# Patient Record
Sex: Male | Born: 1975 | Race: Black or African American | Hispanic: No | Marital: Single | Smoking: Current every day smoker
Health system: Southern US, Community
[De-identification: ages and names within clinical notes are randomized; demographics above are authoritative.]

## PROBLEM LIST (undated history)

## (undated) DIAGNOSIS — E119 Type 2 diabetes mellitus without complications: Secondary | ICD-10-CM

## (undated) DIAGNOSIS — E785 Hyperlipidemia, unspecified: Secondary | ICD-10-CM

## (undated) DIAGNOSIS — I639 Cerebral infarction, unspecified: Secondary | ICD-10-CM

## (undated) DIAGNOSIS — I10 Essential (primary) hypertension: Secondary | ICD-10-CM

---

## 2002-06-22 ENCOUNTER — Encounter: Payer: Self-pay | Admitting: Emergency Medicine

## 2002-06-22 ENCOUNTER — Emergency Department (HOSPITAL_COMMUNITY): Admission: EM | Admit: 2002-06-22 | Discharge: 2002-06-23 | Payer: Self-pay | Admitting: Emergency Medicine

## 2005-03-07 ENCOUNTER — Emergency Department (HOSPITAL_COMMUNITY): Admission: EM | Admit: 2005-03-07 | Discharge: 2005-03-07 | Payer: Self-pay | Admitting: Emergency Medicine

## 2006-02-26 ENCOUNTER — Emergency Department: Payer: Self-pay | Admitting: Emergency Medicine

## 2006-03-11 ENCOUNTER — Emergency Department: Payer: Self-pay | Admitting: Emergency Medicine

## 2006-08-10 ENCOUNTER — Emergency Department: Payer: Self-pay | Admitting: Emergency Medicine

## 2008-02-26 ENCOUNTER — Emergency Department: Payer: Self-pay | Admitting: Emergency Medicine

## 2011-01-25 ENCOUNTER — Emergency Department: Payer: Self-pay | Admitting: Emergency Medicine

## 2011-06-04 ENCOUNTER — Emergency Department (HOSPITAL_COMMUNITY)
Admission: EM | Admit: 2011-06-04 | Discharge: 2011-06-04 | Disposition: A | Discharge status: Home / Self Care | Payer: Self-pay | Attending: Emergency Medicine | Admitting: Emergency Medicine

## 2011-06-04 DIAGNOSIS — R51 Headache: Secondary | ICD-10-CM | POA: Insufficient documentation

## 2011-06-04 DIAGNOSIS — I10 Essential (primary) hypertension: Secondary | ICD-10-CM | POA: Insufficient documentation

## 2011-06-04 DIAGNOSIS — R6884 Jaw pain: Secondary | ICD-10-CM | POA: Insufficient documentation

## 2011-06-04 DIAGNOSIS — H9209 Otalgia, unspecified ear: Secondary | ICD-10-CM | POA: Insufficient documentation

## 2011-06-04 DIAGNOSIS — E119 Type 2 diabetes mellitus without complications: Secondary | ICD-10-CM | POA: Insufficient documentation

## 2011-06-04 DIAGNOSIS — Z8673 Personal history of transient ischemic attack (TIA), and cerebral infarction without residual deficits: Secondary | ICD-10-CM | POA: Insufficient documentation

## 2012-04-21 IMAGING — CT CT HEAD WITHOUT CONTRAST
2 series · 16 of 30 positions shown, 20 images · non-contrast
Comparison: none

REASON FOR EXAM: headache
COMMENTS:

PROCEDURE:     CT  - CT HEAD WITHOUT CONTRAST  - January 25, 2011  [DATE]
RESULT:     History: Headache.
Comparison Study: No recent.

[Series 2: without · axial · non-contrast · 0.41mm/px · z∈[+256,+376]mm · 13 of 30 slices shown, 17 images]
[im 3/30  brain]
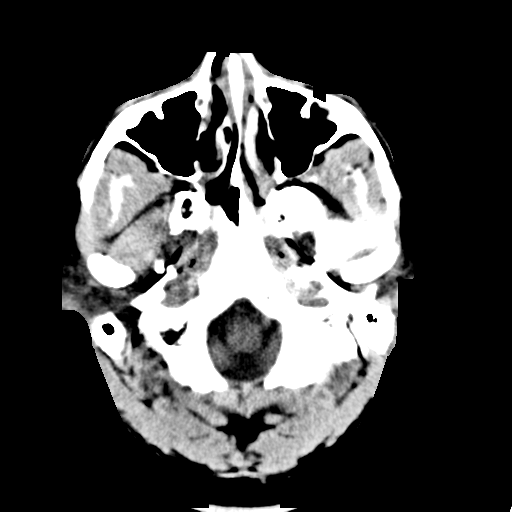
[im 3/30  bone]
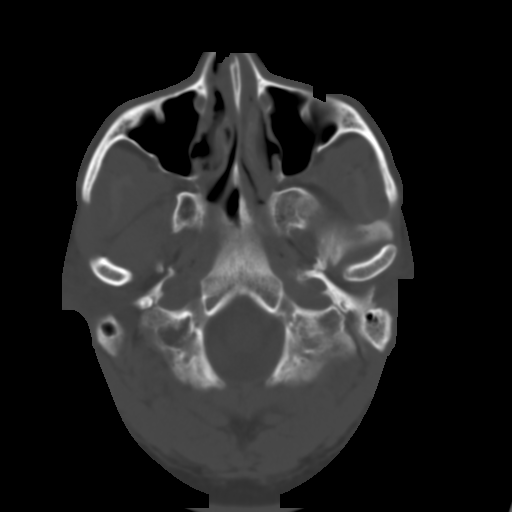
[im 5/30  brain]
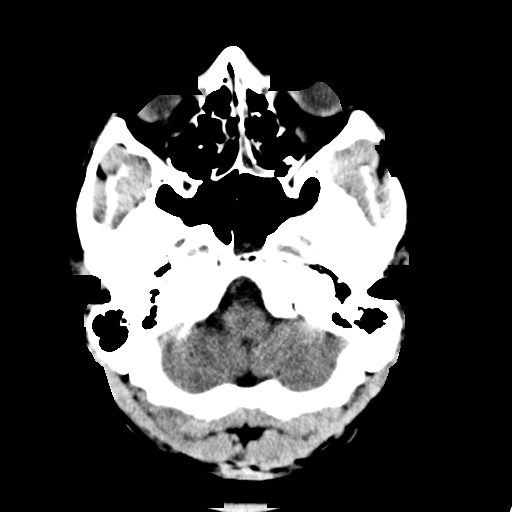
[im 7/30  brain]
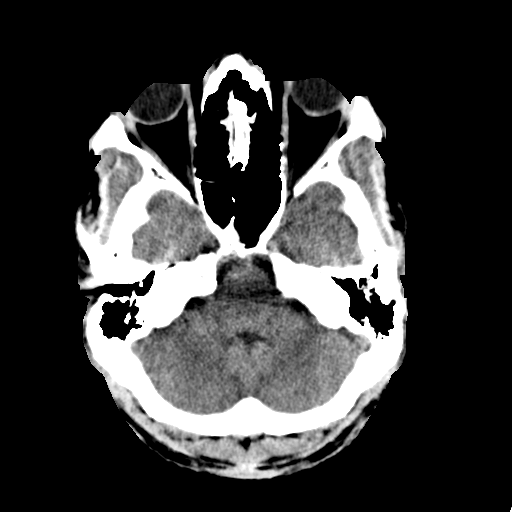
[im 9/30  brain]
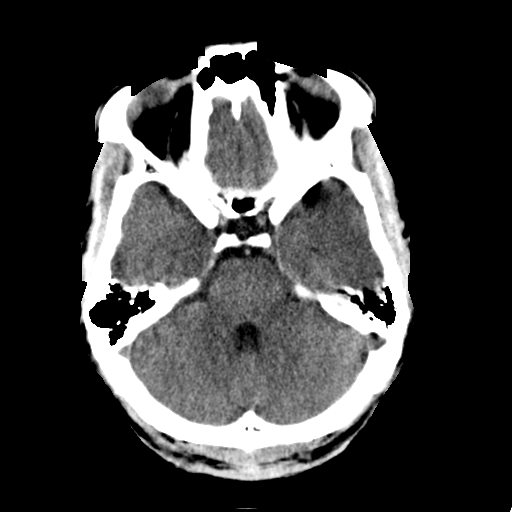
[im 11/30  brain]
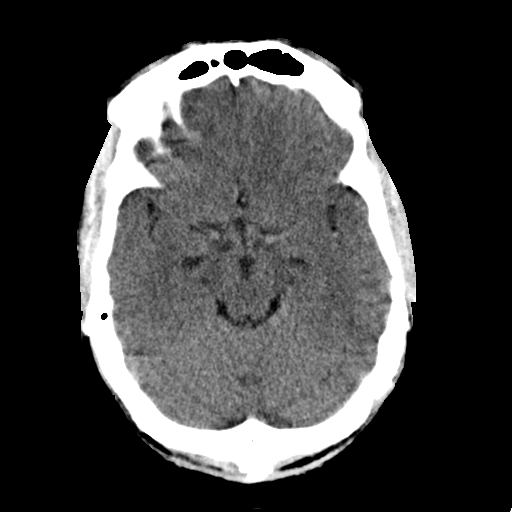
[im 11/30  bone]
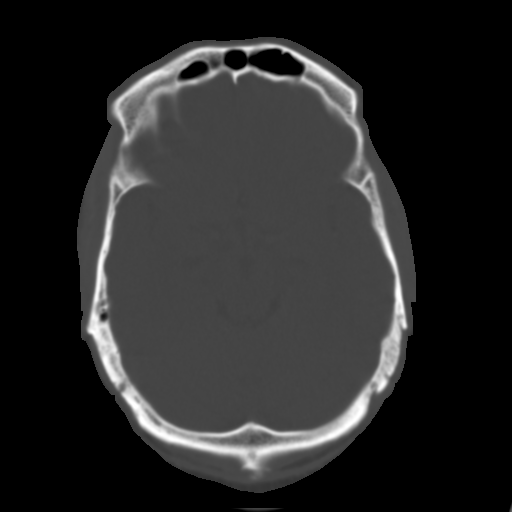
[im 13/30  brain]
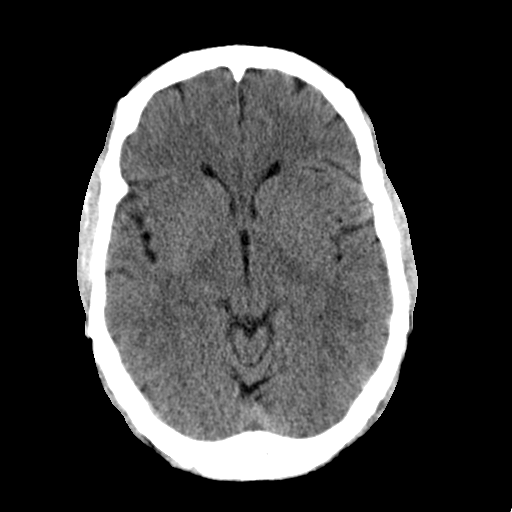
[im 15/30  brain]
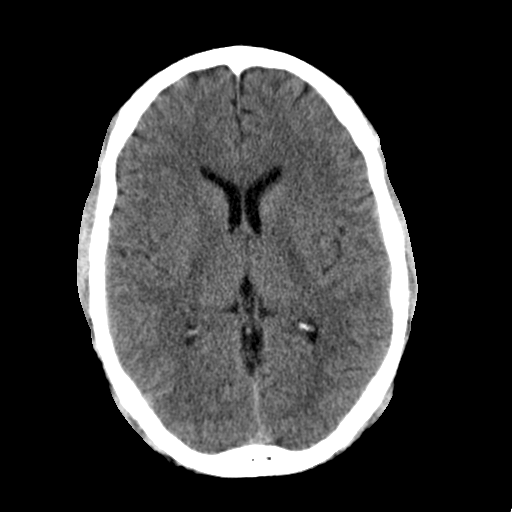
[im 17/30  brain]
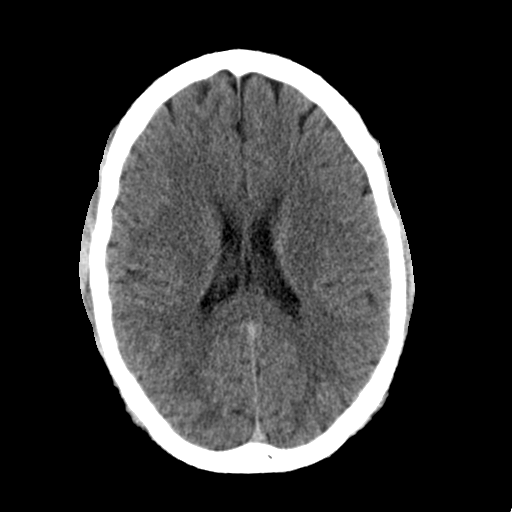
[im 19/30  brain]
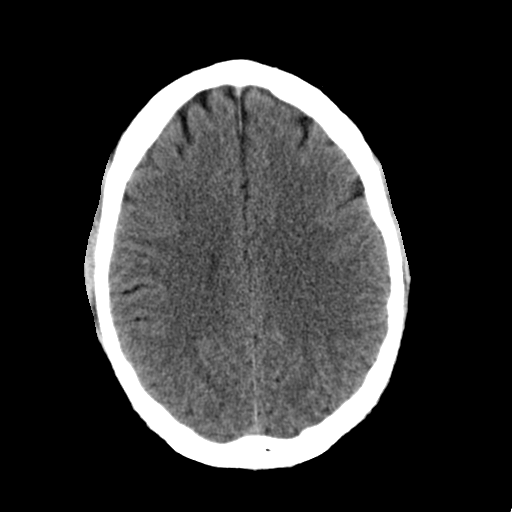
[im 19/30  bone]
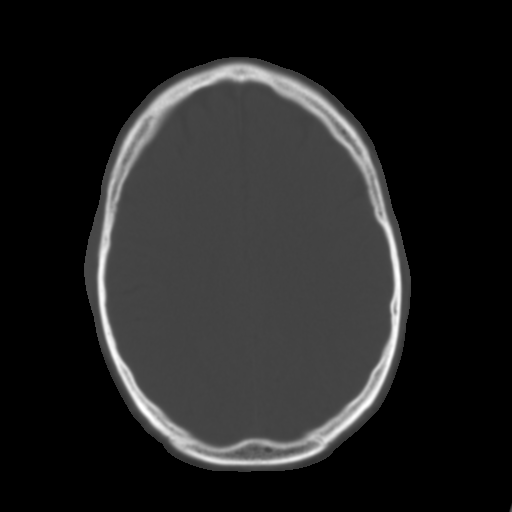
[im 21/30  brain]
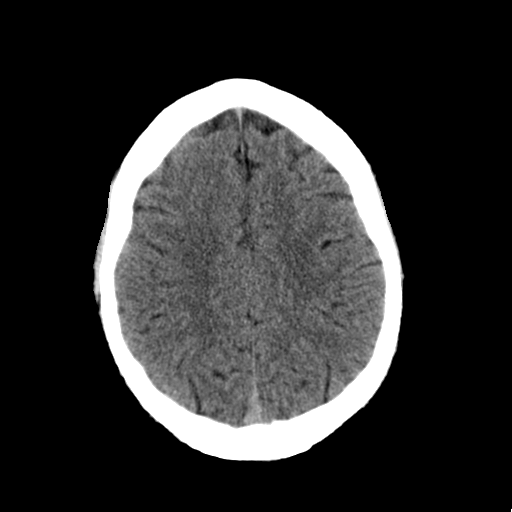
[im 23/30  brain]
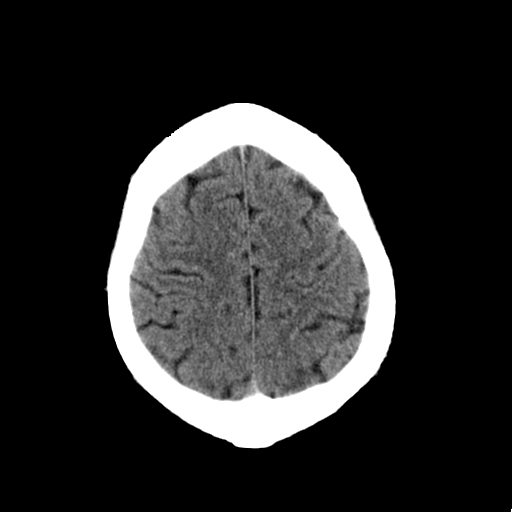
[im 25/30  brain]
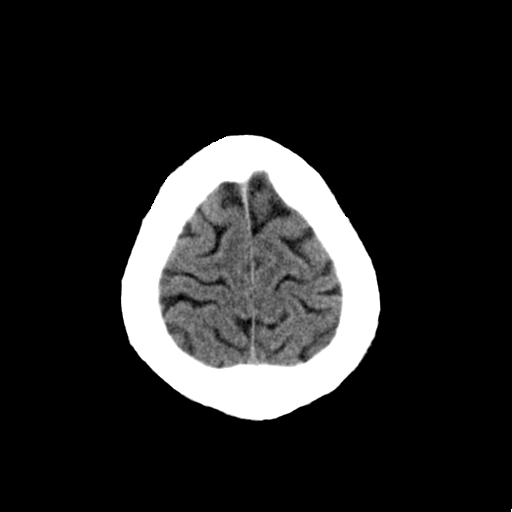
[im 27/30  brain]
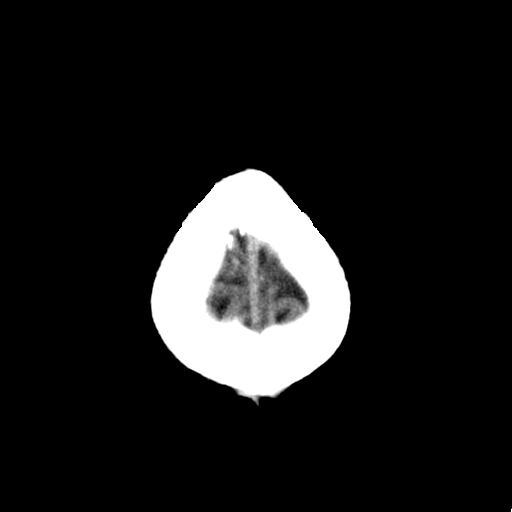
[im 27/30  bone]
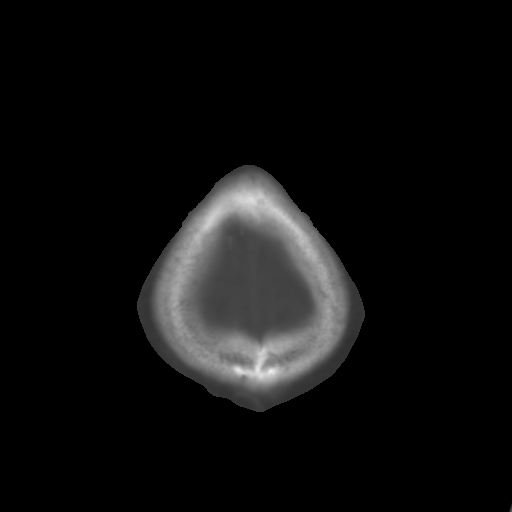

[Series 3: bone · axial · 0.41mm/px · z∈[+256,+296]mm · 3 of 30 slices shown]
[im 3/30  bone]
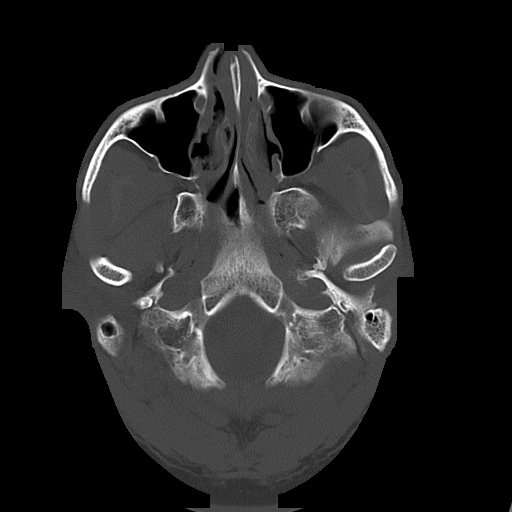
[im 7/30  bone]
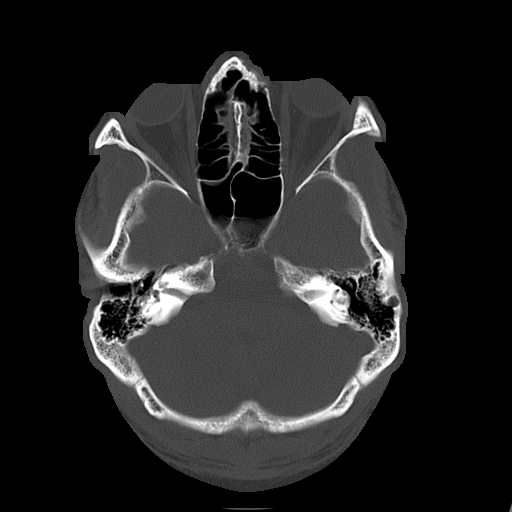
[im 11/30  bone]
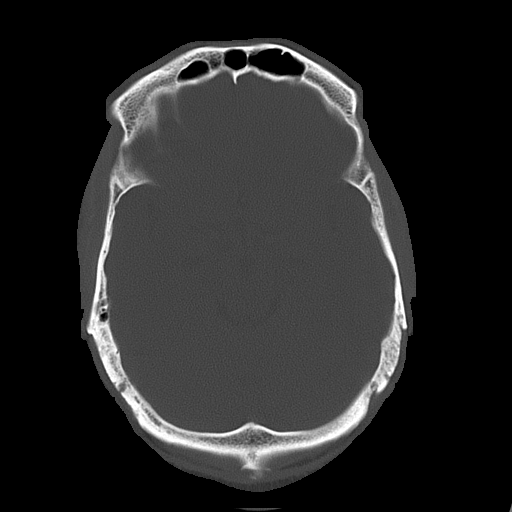

[16 of 30 positions shown; findings below may reference images not displayed]

FINDINGS: Standard head CT obtained. No mass lesion. No hydrocephalus. No
hemorrhage. No acute bony abnormality. Mucus retention cysts noted in
maxillary sinuses.
IMPRESSION: No acute abnormality.

## 2014-10-01 ENCOUNTER — Emergency Department: Payer: Self-pay | Admitting: Emergency Medicine

## 2014-10-04 ENCOUNTER — Emergency Department: Payer: Self-pay | Admitting: Emergency Medicine

## 2016-10-17 ENCOUNTER — Emergency Department: Payer: Self-pay

## 2016-10-17 ENCOUNTER — Encounter: Payer: Self-pay | Admitting: Emergency Medicine

## 2016-10-17 ENCOUNTER — Observation Stay
Admission: EM | Admit: 2016-10-17 | Discharge: 2016-10-18 | Disposition: A | Discharge status: Home / Self Care | Payer: Self-pay | Attending: Internal Medicine | Admitting: Internal Medicine

## 2016-10-17 DIAGNOSIS — R079 Chest pain, unspecified: Secondary | ICD-10-CM | POA: Insufficient documentation

## 2016-10-17 DIAGNOSIS — R29898 Other symptoms and signs involving the musculoskeletal system: Secondary | ICD-10-CM

## 2016-10-17 DIAGNOSIS — Z7982 Long term (current) use of aspirin: Secondary | ICD-10-CM | POA: Insufficient documentation

## 2016-10-17 DIAGNOSIS — F17210 Nicotine dependence, cigarettes, uncomplicated: Secondary | ICD-10-CM | POA: Insufficient documentation

## 2016-10-17 DIAGNOSIS — E785 Hyperlipidemia, unspecified: Secondary | ICD-10-CM | POA: Insufficient documentation

## 2016-10-17 DIAGNOSIS — R262 Difficulty in walking, not elsewhere classified: Secondary | ICD-10-CM

## 2016-10-17 DIAGNOSIS — I10 Essential (primary) hypertension: Secondary | ICD-10-CM | POA: Insufficient documentation

## 2016-10-17 DIAGNOSIS — M6281 Muscle weakness (generalized): Principal | ICD-10-CM

## 2016-10-17 DIAGNOSIS — R200 Anesthesia of skin: Secondary | ICD-10-CM

## 2016-10-17 DIAGNOSIS — R531 Weakness: Secondary | ICD-10-CM

## 2016-10-17 DIAGNOSIS — R202 Paresthesia of skin: Secondary | ICD-10-CM | POA: Insufficient documentation

## 2016-10-17 DIAGNOSIS — I160 Hypertensive urgency: Secondary | ICD-10-CM

## 2016-10-17 DIAGNOSIS — Z8673 Personal history of transient ischemic attack (TIA), and cerebral infarction without residual deficits: Secondary | ICD-10-CM | POA: Insufficient documentation

## 2016-10-17 HISTORY — DX: Essential (primary) hypertension: I10

## 2016-10-17 HISTORY — DX: Cerebral infarction, unspecified: I63.9

## 2016-10-17 HISTORY — DX: Hyperlipidemia, unspecified: E78.5

## 2016-10-17 LAB — CBC
HCT: 47.4 % (ref 40.0–52.0)
Hemoglobin: 16.1 g/dL (ref 13.0–18.0)
MCH: 29.2 pg (ref 26.0–34.0)
MCHC: 34 g/dL (ref 32.0–36.0)
MCV: 85.9 fL (ref 80.0–100.0)
PLATELETS: 238 10*3/uL (ref 150–440)
RBC: 5.52 MIL/uL (ref 4.40–5.90)
RDW: 13.2 % (ref 11.5–14.5)
WBC: 7.3 10*3/uL (ref 3.8–10.6)

## 2016-10-17 LAB — DIFFERENTIAL
BASOS ABS: 0 10*3/uL (ref 0–0.1)
BASOS PCT: 1 %
Eosinophils Absolute: 0.4 10*3/uL (ref 0–0.7)
Eosinophils Relative: 5 %
Lymphocytes Relative: 37 %
Lymphs Abs: 2.7 10*3/uL (ref 1.0–3.6)
MONOS PCT: 8 %
Monocytes Absolute: 0.6 10*3/uL (ref 0.2–1.0)
NEUTROS ABS: 3.6 10*3/uL (ref 1.4–6.5)
Neutrophils Relative %: 49 %

## 2016-10-17 LAB — COMPREHENSIVE METABOLIC PANEL
ALT: 39 U/L (ref 17–63)
AST: 27 U/L (ref 15–41)
Albumin: 4.1 g/dL (ref 3.5–5.0)
Alkaline Phosphatase: 58 U/L (ref 38–126)
Anion gap: 7 (ref 5–15)
BUN: 10 mg/dL (ref 6–20)
CHLORIDE: 103 mmol/L (ref 101–111)
CO2: 30 mmol/L (ref 22–32)
CREATININE: 1.16 mg/dL (ref 0.61–1.24)
Calcium: 8.7 mg/dL — ABNORMAL LOW (ref 8.9–10.3)
GFR calc Af Amer: >60 mL/min (ref >60)
GFR calc non Af Amer: >60 mL/min (ref >60)
Glucose, Bld: 143 mg/dL — ABNORMAL HIGH (ref 65–99)
Potassium: 3.5 mmol/L (ref 3.5–5.1)
SODIUM: 140 mmol/L (ref 135–145)
Total Bilirubin: 0.5 mg/dL (ref 0.3–1.2)
Total Protein: 7.7 g/dL (ref 6.5–8.1)

## 2016-10-17 LAB — TROPONIN I: Troponin I: <0.03 ng/mL (ref <0.03)

## 2016-10-17 LAB — PROTIME-INR
INR: 0.9
Prothrombin Time: 12.1 seconds (ref 11.4–15.2)

## 2016-10-17 LAB — INFLUENZA PANEL BY PCR (TYPE A & B)
Influenza A By PCR: NEGATIVE
Influenza B By PCR: NEGATIVE

## 2016-10-17 LAB — APTT: APTT: 31 s (ref 24–36)

## 2016-10-17 MED ORDER — AMLODIPINE BESYLATE 10 MG PO TABS
10.0000 mg | ORAL_TABLET | Freq: Every day | ORAL | Status: DC
  Administered 2016-10-17 – 2016-10-18 (×2): 10 mg via ORAL
  Filled 2016-10-17 (×2): qty 1

## 2016-10-17 MED ORDER — STROKE: EARLY STAGES OF RECOVERY BOOK
Freq: Once | Status: AC
  Administered 2016-10-17: 18:00:00

## 2016-10-17 MED ORDER — LABETALOL HCL 5 MG/ML IV SOLN
10.0000 mg | Freq: Once | INTRAVENOUS | Status: AC
  Administered 2016-10-17: 10 mg via INTRAVENOUS
  Filled 2016-10-17: qty 4

## 2016-10-17 MED ORDER — ACETAMINOPHEN 650 MG RE SUPP: 650.0000 mg | RECTAL | Status: DC | PRN

## 2016-10-17 MED ORDER — ISOSORBIDE MONONITRATE ER 60 MG PO TB24
30.0000 mg | ORAL_TABLET | Freq: Every day | ORAL | Status: DC
  Administered 2016-10-17 – 2016-10-18 (×2): 30 mg via ORAL
  Filled 2016-10-17 (×2): qty 1

## 2016-10-17 MED ORDER — ASPIRIN 325 MG PO TABS
325.0000 mg | ORAL_TABLET | Freq: Every day | ORAL | Status: DC
  Administered 2016-10-18: 325 mg via ORAL
  Filled 2016-10-17: qty 1

## 2016-10-17 MED ORDER — LABETALOL HCL 5 MG/ML IV SOLN
40.0000 mg | Freq: Once | INTRAVENOUS | Status: AC
  Administered 2016-10-17: 40 mg via INTRAVENOUS
  Filled 2016-10-17: qty 8

## 2016-10-17 MED ORDER — HYDRALAZINE HCL 20 MG/ML IJ SOLN
10.0000 mg | Freq: Four times a day (QID) | INTRAMUSCULAR | Status: DC | PRN
  Administered 2016-10-17: 10 mg via INTRAVENOUS

## 2016-10-17 MED ORDER — ACETAMINOPHEN 160 MG/5ML PO SOLN: 650.0000 mg | ORAL | Status: DC | PRN

## 2016-10-17 MED ORDER — ASPIRIN 81 MG PO CHEW
324.0000 mg | CHEWABLE_TABLET | Freq: Once | ORAL | Status: AC
  Administered 2016-10-17: 324 mg via ORAL
  Filled 2016-10-17: qty 4

## 2016-10-17 MED ORDER — ENOXAPARIN SODIUM 40 MG/0.4ML ~~LOC~~ SOLN
40.0000 mg | SUBCUTANEOUS | Status: DC
  Administered 2016-10-17: 21:00:00 40 mg via SUBCUTANEOUS
  Filled 2016-10-17: qty 0.4

## 2016-10-17 MED ORDER — ASPIRIN 300 MG RE SUPP: 300.0000 mg | Freq: Every day | RECTAL | Status: DC

## 2016-10-17 MED ORDER — NICOTINE 14 MG/24HR TD PT24
14.0000 mg | MEDICATED_PATCH | Freq: Every day | TRANSDERMAL | Status: DC
  Filled 2016-10-17: qty 1

## 2016-10-17 MED ORDER — SENNOSIDES-DOCUSATE SODIUM 8.6-50 MG PO TABS: 1.0000 | ORAL_TABLET | Freq: Every evening | ORAL | Status: DC | PRN

## 2016-10-17 MED ORDER — HYDRALAZINE HCL 20 MG/ML IJ SOLN
INTRAMUSCULAR | Status: AC
  Administered 2016-10-17: 10 mg via INTRAVENOUS
  Filled 2016-10-17: qty 1

## 2016-10-17 MED ORDER — ATORVASTATIN CALCIUM 20 MG PO TABS
40.0000 mg | ORAL_TABLET | Freq: Every day | ORAL | Status: DC
  Administered 2016-10-17: 40 mg via ORAL
  Filled 2016-10-17: qty 2

## 2016-10-17 MED ORDER — ACETAMINOPHEN 325 MG PO TABS
650.0000 mg | ORAL_TABLET | ORAL | Status: DC | PRN
  Administered 2016-10-17: 23:00:00 650 mg via ORAL
  Filled 2016-10-17: qty 2

## 2016-10-17 NOTE — ED Notes (Signed)
Pt states x 1 week has felt L arm stiffness and neck stiffness. States dizziness. Denies blurred vision. Alert and oriented, ambulatory. Pt states hx of stroke in 2012 that left him without use of his L arm. Pt is able to move L arm at this time. Pt states unable to move l leg as well but able to lift it off the bed and walk on it.   Dr. Shaune PollackLord at bedside.

## 2016-10-17 NOTE — ED Notes (Signed)
Pt sleeping, lights turned down for comfort.

## 2016-10-17 NOTE — ED Notes (Signed)
CBG 145  

## 2016-10-17 NOTE — ED Triage Notes (Signed)
Pt comes into the ED via POV c/o left sided weakness and slurred speech.  Patient states that the symptoms have been going on since his birthday last [birth year], but they have significantly gotten worse today.  Patient went to bed last night around 23:00 and woke up this morning with slurred speech.  Patient in NAD at this time with even and unlabored respirations.  Patient states he has chest pain and presents diaphoretic as well.

## 2016-10-17 NOTE — ED Triage Notes (Signed)
Pt reports woke up this morning and had slurred speech from when he woke up.

## 2016-10-17 NOTE — ED Notes (Signed)
Pt states he is feeling better, moving L arm more, bending and lifting at shoulder.

## 2016-10-17 NOTE — ED Provider Notes (Signed)
[facility] Emergency Department Provider Note ____________________________________________   I have reviewed the triage vital signs and the triage nursing note.  HISTORY  Chief Complaint Aphasia   Historian Patient  HPI Catha NottinghamManuel H Dralle is a 41 y.o. male with a history of hypertension, but has been off his medications as sounds like possibly for months, but does take 325 bare aspirin daily, and a history of a stroke affecting left arm and left leg weakness and numbness in 2012, presents today with about one week history of some fullness or heaviness in the left upper extremity, and then this morning when he woke up from sleep he had worsening left leg weakness and left arm weakness as long as paresthesias.  No facial droop.  Reportedly had some mild aphasia as well.  States he's had some mild cough and congestion without fever for the past week. No chest pain.    Past Medical History:  Diagnosis Date  . Hyperlipidemia   . Hypertension     There are no active problems to display for this patient.   History reviewed. No pertinent surgical history.  Prior to Admission medications   Medication Sig Start Date End Date Taking? Authorizing Provider  aspirin 325 MG tablet Take 325 mg by mouth 2 (two) times daily.   Yes Historical Provider, MD  GINSENG-GINKGO-B CMPLX-C-E-CA PO Take 2 tablets by mouth daily.   Yes Historical Provider, MD  Omega-3 Fatty Acids (FISH OIL PO) Take 2 capsules by mouth daily.   Yes Historical Provider, MD    Allergies  Allergen Reactions  . Codeine     No family history on file.  Social History Social History  Substance Use Topics  . Smoking status: Current Every Day Smoker    Packs/day: 0.50  . Smokeless tobacco: Never Used  . Alcohol use Yes    Review of Systems  Constitutional: Negative for fever. Eyes: Negative for visual changes. ENT: Negative for sore throat. Cardiovascular: Negative for chest  pain. Respiratory: Negative for shortness of breath. Gastrointestinal: Negative for abdominal pain, vomiting and diarrhea. Genitourinary: Negative for dysuria. Musculoskeletal: Negative for back pain. Skin: Negative for rash. Neurological: Negative for headache. 10 point Review of Systems otherwise negative ____________________________________________   PHYSICAL EXAM:  VITAL SIGNS: ED Triage Vitals  Enc Vitals Group     BP 10/17/16 1302 (!) 212/123     Pulse Rate 10/17/16 1302 81     Resp 10/17/16 1302 18     Temp 10/17/16 1302 98.1 F (36.7 C)     Temp Source 10/17/16 1302 Oral     SpO2 10/17/16 1302 100 %     Weight 10/17/16 1302 190 lb (86.2 kg)     Height 10/17/16 1302 5\' 11"  (1.803 m)     Head Circumference --      Peak Flow --      Pain Score 10/17/16 1253 2     Pain Loc --      Pain Edu? --      Excl. in GC? --      Constitutional: Alert and oriented. Well appearing Overall. Appears anxious and is diaphoretic. HEENT   Head: Normocephalic and atraumatic.      Eyes: Conjunctivae are normal. PERRL. Normal extraocular movements.      Ears:         Nose: No congestion/rhinnorhea.   Mouth/Throat: Mucous membranes are moist.   Neck: No stridor. Cardiovascular/Chest: Normal rate, regular rhythm.  No murmurs, rubs, or gallops. Respiratory:  Normal respiratory effort without tachypnea nor retractions. Breath sounds are clear and equal bilaterally. No wheezes/rales/rhonchi. Gastrointestinal: Soft. No distention, no guarding, no rebound. Nontender.    Genitourinary/rectal:Deferred Musculoskeletal: Nontender with normal range of motion in all extremities. No joint effusions.  No lower extremity tenderness.  No edema. Neurologic:  No slurred speech. No facial droop. 4-5 strength in proximal and distal left upper shoulder and left lower extremity. Paresthesia left upper and left lower extremity. Skin:  Skin is warm, dry and intact. No rash noted. Psychiatric: Mood and  affect are normal. Speech and behavior are normal. Patient exhibits appropriate insight and judgment.   ____________________________________________  LABS (pertinent positives/negatives)  Labs Reviewed  COMPREHENSIVE METABOLIC PANEL - Abnormal; Notable for the following:       Result Value   Glucose, Bld 143 (*)    Calcium 8.7 (*)    All other components within normal limits  PROTIME-INR  APTT  CBC  DIFFERENTIAL  TROPONIN I  INFLUENZA PANEL BY PCR (TYPE A & B)  CBG MONITORING, ED    ____________________________________________    EKG I, Governor Rooks, MD, the attending physician have personally viewed and interpreted all ECGs.  72 bpm. Normal sinus rhythm. Narrow chemistry normal axis. Normal ST and T-wave  77 bpm. Normal sinus rhythm. Narrow distress. Normal axis. ____________________________________________  RADIOLOGY All Xrays were viewed by me. Imaging interpreted by Radiologist.  CT head without contrast: Normal study __________________________________________  PROCEDURES  Procedure(s) performed: None  Critical Care performed: CRITICAL CARE Performed by: Governor Rooks   Total critical care time: 30 minutes  Critical care time was exclusive of separately billable procedures and treating other patients.  Critical care was necessary to treat or prevent imminent or life-threatening deterioration.  Critical care was time spent personally by me on the following activities: development of treatment plan with patient and/or surrogate as well as nursing, discussions with consultants, evaluation of patient's response to treatment, examination of patient, obtaining history from patient or surrogate, ordering and performing treatments and interventions, ordering and review of laboratory studies, ordering and review of radiographic studies, pulse oximetry and re-evaluation of patient's condition.   ____________________________________________   ED COURSE /  ASSESSMENT AND PLAN  Pertinent labs & imaging results that were available during my care of the patient were reviewed by me and considered in my medical decision making (see chart for details).   Mr. Mintzer came in with left upper extremity weakness waxing and waning for about one week, and then acutely worse when he woke up this morning. TPA excluded from consideration given unknown time of onset.  CT head was obtained immediately and considered normal study per radiology report.  Patient is pretty hypertensive, but patient and fianc status is "normal for him. "He has not been on his medications recently in terms of antihypertensives.  However given he is diaphoretic and no new neurologic findings, I did initiate treatment with labetalol for hypertensive urgency.  About 220, patient is improved in terms of no longer sweating and is now asking for food. His blood pressure has not alleged much and is still with an unchanged amount of around 145. I am going to give a second dose of labetalol.   CONSULTATIONS:   Dr. Thad Ranger, neurology by phone, will see the patient in the hospital. Hospitalist for admission.   Patient / Family / Caregiver informed of clinical course, medical decision-making process, and agree with plan.   ___________________________________________   FINAL CLINICAL IMPRESSION(S) / ED DIAGNOSES  Final diagnoses:  Muscle weakness of left arm  Left leg weakness  Numbness and tingling of left arm and leg  Malignant hypertensive urgency              Note: This dictation was prepared with Dragon dictation. Any transcriptional errors that result from this process are unintentional    Governor Rooks, MD 10/17/16 1421

## 2016-10-17 NOTE — H&P (Signed)
[facility] - [place] [facility]   PATIENT NAME: Omar Dennis    MR#:  [id number]  DATE OF BIRTH:  10/12/1975  DATE OF ADMISSION:  10/17/2016  PRIMARY CARE PHYSICIAN: No PCP Per Patient   REQUESTING/REFERRING PHYSICIAN: Governor Rooksebecca Lord, MD  CHIEF COMPLAINT:   Chief Complaint  Patient presents with  . Aphasia   Left-sided weakness today. HISTORY OF PRESENT ILLNESS:  Omar Dennis  is a 41 y.o. male with a known history of Hypertension, CVA and hyperlipidemia. The patient has a history of stroke affecting left side in 2012. He present to the ED with left leg fullness or heaviness for 1 week, and then weakness since this morning. He also complains of left arm weakness and numbness. He denies any dysphagia or slurred speech or incontinence. He also complains of chest pain since last night, which she is in substernal area, ache and intermittent without radiation. His blood pressure is elevated at 180/120. CAT scan of head is normal. He is treated with aspirin 325 mg. He has been off hypertension medication for a long time.  PAST MEDICAL HISTORY:   Past Medical History:  Diagnosis Date  . Hyperlipidemia   . Hypertension     PAST SURGICAL HISTORY:  History reviewed. No pertinent surgical history. Surgical history.  SOCIAL HISTORY:   Social History  Substance Use Topics  . Smoking status: Current Every Day Smoker    Packs/day: 0.50  . Smokeless tobacco: Never Used  . Alcohol use Yes     Comment: Occasionally  He used marijuana. He drinks alcohol occasionally.  FAMILY HISTORY:   Family History  Problem Relation Age of Onset  . Heart attack Father     DRUG ALLERGIES:   Allergies  Allergen Reactions  . Codeine     REVIEW OF SYSTEMS:   Review of Systems  Constitutional: Negative for chills, fever and malaise/fatigue.  HENT: Negative for congestion and sore throat.   Eyes: Negative for blurred vision and double vision.  Respiratory: Negative for  cough, shortness of breath and wheezing.   Cardiovascular: Negative for chest pain and leg swelling.  Gastrointestinal: Negative for abdominal pain, blood in stool, diarrhea, melena, nausea and vomiting.  Genitourinary: Negative for dysuria, frequency and hematuria.  Musculoskeletal: Negative for back pain and joint pain.  Skin: Negative for itching and rash.  Neurological: Positive for weakness. Negative for dizziness, tingling, tremors, sensory change, speech change, focal weakness, seizures, loss of consciousness and headaches.  Psychiatric/Behavioral: Negative for depression. The patient is not nervous/anxious.     MEDICATIONS AT HOME:   Prior to Admission medications   Medication Sig Start Date End Date Taking? Authorizing Provider  aspirin 325 MG tablet Take 325 mg by mouth 2 (two) times daily.   Yes Historical Provider, MD  GINSENG-GINKGO-B CMPLX-C-E-CA PO Take 2 tablets by mouth daily.   Yes Historical Provider, MD  Omega-3 Fatty Acids (FISH OIL PO) Take 2 capsules by mouth daily.   Yes Historical Provider, MD      VITAL SIGNS:  Blood pressure (!) 175/119, pulse 75, temperature 98.2 F (36.8 C), resp. rate 18, height 5\' 11"  (1.803 m), weight 190 lb (86.2 kg), SpO2 97 %.  PHYSICAL EXAMINATION:  Physical Exam  GENERAL:  41 y.o.-year-old patient lying in the bed with no acute distress.  EYES: Pupils equal, round, reactive to light and accommodation. No scleral icterus. Extraocular muscles intact.  HEENT: Head atraumatic, normocephalic. Oropharynx and nasopharynx clear.  NECK:  Supple, no jugular  venous distention. No thyroid enlargement, no tenderness.  LUNGS: Normal breath sounds bilaterally, no wheezing, rales,rhonchi or crepitation. No use of accessory muscles of respiration.  CARDIOVASCULAR: S1, S2 normal. No murmurs, rubs, or gallops.  ABDOMEN: Soft, nontender, nondistended. Bowel sounds present. No organomegaly or mass.  EXTREMITIES: No pedal edema, cyanosis, or clubbing.   NEUROLOGIC: Cranial nerves II through XII are intact. Muscle strength 5/5 in right extremities and 4.5/5 in left extremities. Sensation intact. Gait not checked.  PSYCHIATRIC: The patient is alert and oriented x 3.  SKIN: No obvious rash, lesion, or ulcer.   LABORATORY PANEL:   CBC  Recent Labs Lab 10/17/16 1255  WBC 7.3  HGB 16.1  HCT 47.4  PLT 238   ------------------------------------------------------------------------------------------------------------------  Chemistries   Recent Labs Lab 10/17/16 1255  NA 140  K 3.5  CL 103  CO2 30  GLUCOSE 143*  BUN 10  CREATININE 1.16  CALCIUM 8.7*  AST 27  ALT 39  ALKPHOS 58  BILITOT 0.5   ------------------------------------------------------------------------------------------------------------------  Cardiac Enzymes  Recent Labs Lab 10/17/16 1255  TROPONINI <0.03   ------------------------------------------------------------------------------------------------------------------  RADIOLOGY:  Ct Head Wo Contrast  Result Date: 10/17/2016 CLINICAL DATA:  Left-sided weakness, slurred speech. EXAM: CT HEAD WITHOUT CONTRAST TECHNIQUE: Contiguous axial images were obtained from the base of the skull through the vertex without intravenous contrast. COMPARISON:  01/25/2011 FINDINGS: Brain: No acute intracranial abnormality. Specifically, no hemorrhage, hydrocephalus, mass lesion, acute infarction, or significant intracranial injury. Vascular: No hyperdense vessel or unexpected calcification. Skull: No acute calvarial abnormality. Sinuses/Orbits: Visualized paranasal sinuses and mastoids clear. Orbital soft tissues unremarkable. Other: None IMPRESSION: Normal study. Electronically Signed   By: Charlett Nose M.D.   On: 10/17/2016 13:11      IMPRESSION AND PLAN:   Left-sided weakness, rule out acute CVA The patient will be placed for observation. Continue aspirin 325 20 one by mouth daily, add Lipitor, follow-up MRI/MRA of  the brain, echocardiogram and carotid duplex. Follow-up hemoglobin A1c and lipid panel. Neuro check, PT and OT.  Hypertension malignancy. The patient was treated with labetalol IV in the ED. I will start Norvasc and indoor,  IV hydralazine when necessary.  Chest pain. Possible due to malignant hypertension. Follow-up troponin level, continue aspirin and Lipitor. Hyperlipidemia. Start Lipitor and follow-up lipid panel. Tobacco abuse. Smoking cessation was counseled for 4 minutes, nicotine patch.  All the records are reviewed and case discussed with ED provider. Management plans discussed with the patient, his fiance and they are in agreement.  CODE STATUS: Full code  TOTAL TIME TAKING CARE OF THIS PATIENT: 52 minutes.    Shaune Pollack M.D on 10/17/2016 at 2:52 PM  Between 7am to 6pm - Pager - [phone]  After 6pm go to [email or web address] - Scientist, research (life sciences) [facility]  Office  [phone]  CC: Primary care physician; No PCP Per Patient   Note: This dictation was prepared with Dragon dictation along with smaller phrase technology. Any transcriptional errors that result from this process are unintentional.

## 2016-10-17 NOTE — ED Notes (Addendum)
Pt states hx of HTN, states he takes medicine for it. Denies use of a blood thinner.   States cough, congestion. Has not taken flu shot.

## 2016-10-18 ENCOUNTER — Observation Stay: Payer: Self-pay

## 2016-10-18 LAB — LIPID PANEL
CHOL/HDL RATIO: 5.4 ratio
Cholesterol: 250 mg/dL — ABNORMAL HIGH (ref 0–200)
HDL: 46 mg/dL (ref >40)
LDL Cholesterol: UNDETERMINED mg/dL (ref 0–99)
Triglycerides: 469 mg/dL — ABNORMAL HIGH (ref <150)
VLDL: UNDETERMINED mg/dL (ref 0–40)

## 2016-10-18 MED ORDER — ATORVASTATIN CALCIUM 40 MG PO TABS: 40.0000 mg | ORAL_TABLET | Freq: Every day | ORAL | 0 refills | Status: DC

## 2016-10-18 MED ORDER — LORAZEPAM 2 MG/ML IJ SOLN
2.0000 mg | Freq: Once | INTRAMUSCULAR | Status: AC
  Administered 2016-10-18: 2 mg via INTRAVENOUS
  Filled 2016-10-18: qty 1

## 2016-10-18 MED ORDER — LISINOPRIL 2.5 MG PO TABS: 2.5000 mg | ORAL_TABLET | Freq: Every day | ORAL | 0 refills | Status: DC

## 2016-10-18 MED ORDER — LORAZEPAM 2 MG/ML IJ SOLN
2.0000 mg | INTRAVENOUS | Status: DC
  Filled 2016-10-18: qty 1

## 2016-10-18 MED ORDER — AMLODIPINE BESYLATE 10 MG PO TABS: 10.0000 mg | ORAL_TABLET | Freq: Every day | ORAL | 0 refills | Status: DC

## 2016-10-18 NOTE — Progress Notes (Signed)
Patient discharged home per MD order. All discharge instructions given and all questions answered. 

## 2016-10-18 NOTE — Discharge Instructions (Signed)
Hypertension Hypertension is another name for high blood pressure. High blood pressure forces your heart to work harder to pump blood. A blood pressure reading has two numbers, which includes a higher number over a lower number (example: 110/72). Follow these instructions at home:  Have your blood pressure rechecked by your doctor.  Only take medicine as told by your doctor. Follow the directions carefully. The medicine does not work as well if you skip doses. Skipping doses also puts you at risk for problems.  Do not smoke.  Monitor your blood pressure at home as told by your doctor. Contact a doctor if:  You think you are having a reaction to the medicine you are taking.  You have repeat headaches or feel dizzy.  You have puffiness (swelling) in your ankles.  You have trouble with your vision. Get help right away if:  You get a very bad headache and are confused.  You feel weak, numb, or faint.  You get chest or belly (abdominal) pain.  You throw up (vomit).  You cannot breathe very well. This information is not intended to replace advice given to you by your health care provider. Make sure you discuss any questions you have with your health care provider. Document Released: 02/27/2008 Document Revised: 02/16/2016 Document Reviewed: 07/03/2013 Elsevier Interactive Patient Education  2017 [employer]  

## 2016-10-18 NOTE — Progress Notes (Signed)
PT Cancellation Note  Patient Details Name: Omar Dennis MRN: [id number] DOB: [birth year]   Cancelled Treatment:    Reason Eval/Treat Not Completed: Patient at procedure or test/unavailable Attempted x2 this AM, pt no available.  Will try back this afternoon.   Malachi ProGalen R Maxie Debose, DPT 10/18/2016, 12:47 PM

## 2016-10-18 NOTE — Evaluation (Signed)
Occupational Therapy Evaluation Patient Details Name: Omar Dennis MRN: [id number] DOB: 12-31-1975 Today's Date: 10/18/2016    History of Present Illness Pt is a 41 y.o. male with a known history of HTN, CVA and HLD. The patient has a history of stroke affecting left side in 2012. He presented to ED with left leg fullness or heaviness for 1 week, and then weakness since morning of 1/24. He also c/o left arm weakness and numbness. He denies any dysphagia or slurred speech or incontinence. Pt c/o chest pain since 1/23, which is in substernal area, ache and intermittent without radiation. CAT scan of head is normal. Treated in treated ED with aspirin 325 mg. He has been off hypertension medication for a long time.   Clinical Impression   Pt seen for OT evaluation this date. Pt presents with LUE weakness which pt states has improved slightly since arriving to the ED on 1/24 but reports still weaker than his baseline (past CVA with L side weakness in 2012). Pt reports tightness with some movements of LUE (shoulder abduction, head rotation to L side). Pt presents with pain, impaired UE functional use, decreased coordination/strength/ROM, and decreased knowledge of AE for self care tasks and would benefit from outpatient skilled OT services to address these noted impairments and functional deficits in self care tasks to maximize independence with ADL and return to PLOF. No further acute care OT needs at this time, will sign off.    Follow Up Recommendations  Outpatient OT    Equipment Recommendations  None recommended by OT    Recommendations for Other Services       Precautions / Restrictions Precautions Precautions: Fall Restrictions Weight Bearing Restrictions: No      Mobility Bed Mobility Overal bed mobility: Modified Independent             General bed mobility comments: pt cautious while performing, no physical assist required  Transfers Overall transfer level: Needs  assistance   Transfers: Sit to/from Stand Sit to Stand: Min guard         General transfer comment: no LOB noted, good safety and hand placement    Balance Overall balance assessment: No apparent balance deficits (not formally assessed)                                          ADL Overall ADL's : Needs assistance/impaired Eating/Feeding: Modified independent Eating/Feeding Details (indicate cue type and reason): uses L hand as support for R hand when opening container/packets Grooming: Wash/dry hands;Wash/dry face;Oral care;Standing;Modified independent;Min guard Grooming Details (indicate cue type and reason): pt unable to squeeze small tube of toothpaste, was able to stabilize toothbrush with L hand while applying toothpaste with R hand; min guard while in standing at sink for approx 5 minutes Upper Body Bathing: Sitting;Set up;Supervision/ safety   Lower Body Bathing: Set up;Minimal assistance;Sitting/lateral leans;Sit to/from stand   Upper Body Dressing : Sitting;Modified independent   Lower Body Dressing: Sitting/lateral leans;Sit to/from stand;Minimal assistance   Toilet Transfer: Data processing managerMin guard;Regular Toilet   Toileting- Clothing Manipulation and Hygiene: Modified independent       Functional mobility during ADLs: Min guard General ADL Comments: overall min assist for LB ADL, compensatory strategies for UB ADL to minimize shoulder abduction leading to pain/tightness; decreased fine motor coordination for self feeding/grooming using L hand     Vision Vision Assessment?: No apparent visual  deficits   Perception     Praxis      Pertinent Vitals/Pain Pain Assessment: 0-10 Pain Score: 7  Pain Location: L shoulder area with head rotation to the left side and with LUE shoulder abduction Pain Descriptors / Indicators: Tightness Pain Intervention(s): Limited activity within patient's tolerance;Monitored during session;Repositioned     Hand Dominance  Right   Extremity/Trunk Assessment Upper Extremity Assessment Upper Extremity Assessment: RUE deficits/detail;LUE deficits/detail RUE Deficits / Details: full ROM, 5/5 strength LUE Deficits / Details: full AROM except shoulder abduction AROM limited due to pain; grossly 4-/5 strength LUE Coordination: decreased fine motor (pt unable to squeeze small tube of toothpaste, was able to stabilize toothbrush with L hand while applying toothpaste with R hand)   Lower Extremity Assessment Lower Extremity Assessment: Overall WFL for tasks assessed   Cervical / Trunk Assessment Cervical / Trunk Assessment: Normal   Communication Communication Communication: No difficulties   Cognition Arousal/Alertness: Awake/alert Behavior During Therapy: WFL for tasks assessed/performed Overall Cognitive Status: Within Functional Limits for tasks assessed                     General Comments       Exercises       Shoulder Instructions      Home Living Family/patient expects to be discharged to:: Other (Comment) (pt reports living in hotel for past year with girlfriend) Living Arrangements: Spouse/significant other (girlfriend) Available Help at Discharge: Family;Available PRN/intermittently (girlfriend available, but works) Type of Home: Other(Comment) (hotel ) Home Access:  (no stairs to enter hotel, but 2 flights of stairs w/ bilat arm rails to negotiate to access room)     Home Layout: One level     Bathroom Shower/Tub: Tub/shower unit Shower/tub characteristics: Engineer, building services: Standard Bathroom Accessibility: Yes How Accessible: Accessible via walker Home Equipment: None          Prior Functioning/Environment Level of Independence: Independent;Needs assistance    ADL's / Homemaking Assistance Needed: Pt reports Independent up until past month when he occasionally requires girlfriend to assist with LB ADL tasks for stability/safety due to L side weakness   Comments:  Pt was driving indep PTA, works part time as a Landscape architect Problem List: Decreased coordination;Pain;Decreased strength;Decreased range of motion;Decreased knowledge of use of DME or AE;Impaired UE functional use   OT Treatment/Interventions:      OT Goals(Current goals can be found in the care plan section) Acute Rehab OT Goals Patient Stated Goal: go home OT Goal Formulation: With patient Time For Goal Achievement: 11/01/16 Potential to Achieve Goals: Good  OT Frequency:     Barriers to D/C:    2 flights of stairs he must negotiate, no elevator at hotel he lives in       Co-evaluation              End of Session Equipment Utilized During Treatment: Gait belt  Activity Tolerance: Patient tolerated treatment well Patient left: in bed;with call bell/phone within reach;with nursing/sitter in room   Time: [phone] OT Time Calculation (min): 24 min Charges:  OT General Charges $OT Visit: 1 Procedure OT Evaluation $OT Eval Moderate Complexity: 1 Procedure OT Treatments $Self Care/Home Management : 8-22 mins G-Codes: OT G-codes **NOT FOR INPATIENT CLASS** Functional Assessment Tool Used: clinical judgment Functional Limitation: Self care Self Care Current Status (V4098): At least 1 percent but less than 20 percent impaired, limited or restricted Self Care Goal Status (J1914):  At least 1 percent but less than 20 percent impaired, limited or restricted Self Care Discharge Status [phone]): At least 1 percent but less than 20 percent impaired, limited or restricted  Eliezer Bottom, OTR/L 10/18/2016, 11:05 AM

## 2016-10-18 NOTE — Care Management (Signed)
Admitted to this facility with the diagnosis of left sided weakness under observation status. Lives with finance. Mother is Thurston PoundsCharlotte Hooker [phone]), Last seen a physician in 2012. [place] Takes care of all basic activities of daily living himself. Gave application to the [facility]. Will update Lorrie at [facility]. Discharge to home today per Dr. Sherryll BurgerShah.                                                                                                         Gwenette GreetBrenda S Calloway Andrus RN MSN CCM Care Management

## 2016-10-18 NOTE — Evaluation (Signed)
Physical Therapy Evaluation Patient Details Name: Omar Dennis MRN: [id number] DOB: 08/14/1976 Today's Date: 10/18/2016   History of Present Illness  Pt is a 41 y.o. male with a history of HTN, CVA and HLD. Previous stroke affecting left side in 2012. He presented to ED with left leg fullness or heaviness for 1 week, and then weakness since morning of 1/24. He also c/o more significant left arm weakness and numbness. He denies any dysphagia or slurred speech or incontinence. Pt c/o chest pain since 1/23, which is in substernal area, ache and intermittent without radiation. CAT scan of head is normal. Treated in treated ED with aspirin 325 mg. He has been off hypertension medication for a long time.  Clinical Impression  Pt did well with PT and was able to walk ~300 ft w/o AD and negotiated steps w/o an issue.  He was in good spirits t/o the episode and generally was safe despite some minimal stagger stepping.  He showed no true LOBs and good general confidence/safety despite some coordination/quality of motion issues.     Follow Up Recommendations Outpatient PT    Equipment Recommendations       Recommendations for Other Services       Precautions / Restrictions Precautions Precautions: Fall Restrictions Weight Bearing Restrictions: No      Mobility  Bed Mobility Overal bed mobility: Modified Independent                Transfers Overall transfer level: Modified independent Equipment used: None Transfers: Sit to/from Stand Sit to Stand: Min guard         General transfer comment: Pt able to rise with good confidence and no balance issues - overall WFL  Ambulation/Gait Ambulation/Gait assistance: Min guard;Supervision Ambulation Distance (Feet): 300 Feet Assistive device: None       General Gait Details: Pt is able to ambulate with slow, occasionally slight catch/buckling in the L LE but no LOBs (small stagger steps that he is able to self arrest).     Stairs Stairs: Yes Stairs assistance: Supervision Stair Management: One rail Right;Alternating pattern;Forwards Number of Stairs: 15 General stair comments: Pt able to negotiate up/down steps well with UE use, does have some   Wheelchair Mobility    Modified Rankin (Stroke Patients Only)       Balance Overall balance assessment: Modified Independent (no LOBs, but multiple self arrested stagger steps)                                           Pertinent Vitals/Pain Pain Assessment:  (unrated general pain in L neck/UT/arm )    Home Living Family/patient expects to be discharged to:: Other (Comment) (lives in hotel?) Living Arrangements: Spouse/significant other Available Help at Discharge: Family;Available PRN/intermittently   Home Access: Stairs to enter Entrance Stairs-Rails: Lawyer of Steps: flight Home Layout: One level        Prior Function Level of Independence: Independent         Comments: Pt reports he was able to be relatively active, some limitations     Hand Dominance        Extremity/Trunk Assessment   Upper Extremity Assessment Upper Extremity Assessment: Defer to OT evaluation    Lower Extremity Assessment Lower Extremity Assessment: LLE deficits/detail LLE Deficits / Details: L LE grossly 3+ to 4-/5, some quality of motion deficit, but he reports "  near his normal"       Communication      Cognition Arousal/Alertness: Awake/alert Behavior During Therapy: WFL for tasks assessed/performed Overall Cognitive Status: Within Functional Limits for tasks assessed                      General Comments      Exercises     Assessment/Plan    PT Assessment Patient needs continued PT services  PT Problem List Decreased strength;Decreased activity tolerance;Decreased mobility;Decreased balance;Decreased coordination;Decreased cognition;Decreased range of motion;Decreased safety  awareness          PT Treatment Interventions Gait training;Stair training;Therapeutic activities;Therapeutic exercise;Neuromuscular re-education;Balance training;Functional mobility training    PT Goals (Current goals can be found in the Care Plan section)  Acute Rehab PT Goals Patient Stated Goal: go home PT Goal Formulation: With patient Time For Goal Achievement: 11/01/16 Potential to Achieve Goals: Good    Frequency 7X/week   Barriers to discharge        Co-evaluation               End of Session Equipment Utilized During Treatment: Gait belt Activity Tolerance: Patient tolerated treatment well        Functional Assessment Tool Used: clinical judgement Functional Limitation: Mobility: Walking and moving around Mobility: Walking and Moving Around Current Status (Z6109(G8978): At least 1 percent but less than 20 percent impaired, limited or restricted Mobility: Walking and Moving Around Goal Status [phone](G8979): 0 percent impaired, limited or restricted    Time: 1354-1412 PT Time Calculation (min) (ACUTE ONLY): 18 min   Charges:   PT Evaluation $PT Eval Low Complexity: 1 Procedure     PT G Codes:   PT G-Codes **NOT FOR INPATIENT CLASS** Functional Assessment Tool Used: clinical judgement Functional Limitation: Mobility: Walking and moving around Mobility: Walking and Moving Around Current Status (U9811(G8978): At least 1 percent but less than 20 percent impaired, limited or restricted Mobility: Walking and Moving Around Goal Status [phone](G8979): 0 percent impaired, limited or restricted    Malachi ProGalen R Ranald Alessio, DPT 10/18/2016, 3:07 PM

## 2016-10-19 LAB — GLUCOSE, CAPILLARY: Glucose-Capillary: 145 mg/dL — ABNORMAL HIGH (ref 65–99)

## 2016-10-19 LAB — HEMOGLOBIN A1C
Hgb A1c MFr Bld: 6.9 % — ABNORMAL HIGH (ref 4.8–5.6)
MEAN PLASMA GLUCOSE: 151 mg/dL

## 2016-10-21 NOTE — Discharge Summary (Signed)
[facility]   PATIENT NAME: Omar Dennis    MR#:  [id number]  DATE OF BIRTH:  08-10-1976  DATE OF ADMISSION:  10/17/2016   ADMITTING PHYSICIAN: Shaune Pollack, MD  DATE OF DISCHARGE: 10/18/2016  2:30 PM  PRIMARY CARE PHYSICIAN: No PCP Per Patient   ADMISSION DIAGNOSIS:  Left leg weakness [R29.898] Muscle weakness of left arm [M62.81] Malignant hypertensive urgency [I16.0] Numbness and tingling of left arm and leg [R20.0, R20.2] DISCHARGE DIAGNOSIS:  Active Problems:   Left-sided weakness  SECONDARY DIAGNOSIS:   Past Medical History:  Diagnosis Date  . Hyperlipidemia   . Hypertension   . Stroke (cerebrum) [facility])    HOSPITAL COURSE:  41 y.o. male with a known history of Hypertension, CVA and hyperlipidemia. The patient has a history of stroke affecting left side in 2012. He present to the ED with left leg fullness or heaviness for 1 week, and then weakness. He also complains of left arm weakness and numbness  * Left-sided weakness: stroke ruled out with MRI - weakness for transient and resolved, some concern for drugs of abuse  * Uncontrolled Hypertension: could be due to drugs of abuse. Much better controlled at DC  * Chest pain. Possible due to malignant hypertension. Neg troponins. Concerning for drugs of abuse  * Tobacco abuse. Smoking cessation was counseled for 4 minutes  DISCHARGE CONDITIONS:  stable CONSULTS OBTAINED:  Treatment Team:  Kym Groom, MD DRUG ALLERGIES:   Allergies  Allergen Reactions  . Codeine    DISCHARGE MEDICATIONS:   Allergies as of 10/18/2016      Reactions   Codeine       Medication List    TAKE these medications   amLODipine 10 MG tablet Commonly known as:  NORVASC Take 1 tablet (10 mg total) by mouth daily.   aspirin 325 MG tablet Take 325 mg by mouth 2 (two) times daily.   atorvastatin 40 MG tablet Commonly known as:  LIPITOR Take 1 tablet (40 mg total) by mouth daily at  6 PM.   FISH OIL PO Take 2 capsules by mouth daily.   GINSENG-GINKGO-B CMPLX-C-E-CA PO Take 2 tablets by mouth daily.   lisinopril 2.5 MG tablet Commonly known as:  PRINIVIL,ZESTRIL Take 1 tablet (2.5 mg total) by mouth daily.        DISCHARGE INSTRUCTIONS:   DIET:  Regular diet DISCHARGE CONDITION:  Good ACTIVITY:  Activity as tolerated OXYGEN:  Home Oxygen: No.  Oxygen Delivery: room air DISCHARGE LOCATION:  home   If you experience worsening of your admission symptoms, develop shortness of breath, life threatening emergency, suicidal or homicidal thoughts you must seek medical attention immediately by calling 911 or calling your MD immediately  if symptoms less severe.  You Must read complete instructions/literature along with all the possible adverse reactions/side effects for all the Medicines you take and that have been prescribed to you. Take any new Medicines after you have completely understood and accpet all the possible adverse reactions/side effects.   Please note  You were cared for by a hospitalist during your hospital stay. If you have any questions about your discharge medications or the care you received while you were in the hospital after you are discharged, you can call the unit and asked to speak with the hospitalist on call if the hospitalist that took care of you is not available. Once you are discharged, your primary care physician will handle any further medical issues. Please  note that NO REFILLS for any discharge medications will be authorized once you are discharged, as it is imperative that you return to your primary care physician (or establish a relationship with a primary care physician if you do not have one) for your aftercare needs so that they can reassess your need for medications and monitor your lab values.    On the day of Discharge:  VITAL SIGNS:  Blood pressure (!) 139/91, pulse (!) 106, temperature 97.8 F (36.6 C), temperature  source Oral, resp. rate 18, height 5\' 11"  (1.803 m), weight 86.2 kg (190 lb), SpO2 99 %. PHYSICAL EXAMINATION:  GENERAL:  41 y.o.-year-old patient lying in the bed with no acute distress.  EYES: Pupils equal, round, reactive to light and accommodation. No scleral icterus. Extraocular muscles intact.  HEENT: Head atraumatic, normocephalic. Oropharynx and nasopharynx clear.  NECK:  Supple, no jugular venous distention. No thyroid enlargement, no tenderness.  LUNGS: Normal breath sounds bilaterally, no wheezing, rales,rhonchi or crepitation. No use of accessory muscles of respiration.  CARDIOVASCULAR: S1, S2 normal. No murmurs, rubs, or gallops.  ABDOMEN: Soft, non-tender, non-distended. Bowel sounds present. No organomegaly or mass.  EXTREMITIES: No pedal edema, cyanosis, or clubbing.  NEUROLOGIC: Cranial nerves II through XII are intact. Muscle strength 5/5 in all extremities. Sensation intact. Gait not checked.  PSYCHIATRIC: The patient is alert and oriented x 3.  SKIN: No obvious rash, lesion, or ulcer.  DATA REVIEW:   CBC  Recent Labs Lab 10/17/16 1255  WBC 7.3  HGB 16.1  HCT 47.4  PLT 238    Chemistries   Recent Labs Lab 10/17/16 1255  NA 140  K 3.5  CL 103  CO2 30  GLUCOSE 143*  BUN 10  CREATININE 1.16  CALCIUM 8.7*  AST 27  ALT 39  ALKPHOS 58  BILITOT 0.5   Follow-up Information    [facility]. Schedule an appointment as soon as possible for a visit in 1 week(s).   Specialty:  Primary Care Contact information: [address] [place] [id number] [phone]          Management plans discussed with the patient, family and they are in agreement.  CODE STATUS:  Code Status History    Date Active Date Inactive Code Status Order ID Comments User Context   10/17/2016  5:54 PM 10/18/2016  5:48 PM Full Code [id number]  Shaune PollackQing Chen, MD Inpatient      TOTAL TIME TAKING CARE OF THIS PATIENT: 45 minutes.     Delfino LovettVipul Arlett Goold M.D on 10/21/2016 at 2:37 PM  Between 7am to 6pm - Pager - [phone]  After 6pm go to [email or web address] - Scientist, research (life sciences)password [email or web address]  [facility]  Hospitalists  Office  [phone]  CC: Primary care physician; No PCP Per Patient   Note: This dictation was prepared with Dragon dictation along with smaller phrase technology. Any transcriptional errors that result from this process are unintentional.

## 2017-06-14 ENCOUNTER — Emergency Department
Admission: EM | Admit: 2017-06-14 | Discharge: 2017-06-14 | Disposition: A | Discharge status: Home / Self Care | Payer: Self-pay | Attending: Emergency Medicine | Admitting: Emergency Medicine

## 2017-06-14 ENCOUNTER — Encounter: Payer: Self-pay | Admitting: Emergency Medicine

## 2017-06-14 ENCOUNTER — Emergency Department: Payer: Self-pay

## 2017-06-14 DIAGNOSIS — F17200 Nicotine dependence, unspecified, uncomplicated: Secondary | ICD-10-CM | POA: Insufficient documentation

## 2017-06-14 DIAGNOSIS — Z9114 Patient's other noncompliance with medication regimen: Secondary | ICD-10-CM | POA: Insufficient documentation

## 2017-06-14 DIAGNOSIS — R079 Chest pain, unspecified: Secondary | ICD-10-CM | POA: Insufficient documentation

## 2017-06-14 DIAGNOSIS — Z8673 Personal history of transient ischemic attack (TIA), and cerebral infarction without residual deficits: Secondary | ICD-10-CM | POA: Insufficient documentation

## 2017-06-14 DIAGNOSIS — I10 Essential (primary) hypertension: Secondary | ICD-10-CM | POA: Insufficient documentation

## 2017-06-14 LAB — BASIC METABOLIC PANEL
Anion gap: 8 (ref 5–15)
BUN: 11 mg/dL (ref 6–20)
CHLORIDE: 104 mmol/L (ref 101–111)
CO2: 27 mmol/L (ref 22–32)
CREATININE: 1.1 mg/dL (ref 0.61–1.24)
Calcium: 9.2 mg/dL (ref 8.9–10.3)
GFR calc Af Amer: >60 mL/min (ref >60)
GFR calc non Af Amer: >60 mL/min (ref >60)
GLUCOSE: 104 mg/dL — AB (ref 65–99)
Potassium: 3.8 mmol/L (ref 3.5–5.1)
Sodium: 139 mmol/L (ref 135–145)

## 2017-06-14 LAB — CBC
HCT: 41.1 % (ref 40.0–52.0)
Hemoglobin: 14.3 g/dL (ref 13.0–18.0)
MCH: 30 pg (ref 26.0–34.0)
MCHC: 34.8 g/dL (ref 32.0–36.0)
MCV: 86.2 fL (ref 80.0–100.0)
PLATELETS: 207 10*3/uL (ref 150–440)
RBC: 4.77 MIL/uL (ref 4.40–5.90)
RDW: 13 % (ref 11.5–14.5)
WBC: 7.2 10*3/uL (ref 3.8–10.6)

## 2017-06-14 LAB — TROPONIN I
Troponin I: <0.03 ng/mL (ref <0.03)
Troponin I: <0.03 ng/mL (ref <0.03)

## 2017-06-14 MED ORDER — LISINOPRIL 10 MG PO TABS
20.0000 mg | ORAL_TABLET | Freq: Once | ORAL | Status: AC
  Administered 2017-06-14: 20 mg via ORAL
  Filled 2017-06-14: qty 2

## 2017-06-14 MED ORDER — LISINOPRIL 20 MG PO TABS: 20.0000 mg | ORAL_TABLET | Freq: Every day | ORAL | 0 refills | Status: DC

## 2017-06-14 MED ORDER — ASPIRIN 81 MG PO CHEW
324.0000 mg | CHEWABLE_TABLET | Freq: Once | ORAL | Status: AC
  Administered 2017-06-14: 324 mg via ORAL
  Filled 2017-06-14: qty 4

## 2017-06-14 NOTE — ED Provider Notes (Signed)
[facility] Emergency Department Provider Note  ____________________________________________  Time seen: Approximately 8:21 PM  I have reviewed the triage vital signs and the nursing notes.   HISTORY  Chief Complaint Chest Pain    HPI Omar Dennis is a 41 y.o. male with a history of untreated hypertension, CVA, remote history of cocaine abuse,presenting with chest pain. The patient reports that one week ago, while he was in jail, he had an episode of chest pain which she describes as a pain like someone pushing a fist on his chest. It did not radiate and was not associated with shortness of breath, lightheadedness, syncope, palpitations, nausea or vomiting. Today he was laying in bed after waking up his morning, and had a similar episode which self resolved after less than 1 minute. He has never had a risk stratification study.he no longer is taking aspirin. The patient has been symptom-free throughout the day.   Past Medical History:  Diagnosis Date  . Hyperlipidemia   . Hypertension   . Stroke (cerebrum) [facility])     Patient Active Problem List   Diagnosis Date Noted  . Left-sided weakness 10/17/2016    History reviewed. No pertinent surgical history.  Current Outpatient Rx  . Order #: [id number] Class: Normal  . Order #: [id number] Class: Historical Med  . Order #: [id number] Class: Normal  . Order #: [id number] Class: Historical Med  . Order #: [id number] Class: Print  . Order #: [id number] Class: Historical Med    Allergies Codeine  Family History  Problem Relation Age of Onset  . Heart attack Father     Social History Social History  Substance Use Topics  . Smoking status: Current Every Day Smoker    Packs/day: 0.50  . Smokeless tobacco: Never Used  . Alcohol use Yes     Comment: Occasionally    Review of Systems Constitutional: No fever/chills.no lightheadedness or syncope. Eyes: No visual changes. ENT: No sore throat. No congestion or  rhinorrhea. Cardiovascular: positivechest pain. Denies palpitations. Respiratory: Denies shortness of breath.  No cough. Gastrointestinal: No abdominal pain.  No nausea, no vomiting.  No diarrhea.  No constipation. Genitourinary: Negative for dysuria. Musculoskeletal: Negative for back pain.no lower extremity swelling or calf pain Skin: Negative for rash. Neurological: Negative for headaches. No focal numbness, tingling or weakness.     ____________________________________________   PHYSICAL EXAM:  VITAL SIGNS: ED Triage Vitals  Enc Vitals Group     BP 06/14/17 1738 (!) 166/111     Pulse Rate 06/14/17 1738 81     Resp 06/14/17 1738 16     Temp 06/14/17 1738 98.9 F (37.2 C)     Temp Source 06/14/17 1738 Oral     SpO2 06/14/17 1738 99 %     Weight 06/14/17 1738 200 lb (90.7 kg)     Height 06/14/17 1738  (1.753 m)     Head Circumference --      Peak Flow --      Pain Score 06/14/17 1737 3     Pain Loc --      Pain Edu? --      Excl. in GC? --     Constitutional: Alert and oriented. Well appearing and in no acute distress. Answers questions appropriately. Eyes: Conjunctivae are normal.  EOMI. No scleral icterus. Head: Atraumatic. Nose: No congestion/rhinnorhea. Mouth/Throat: Mucous membranes are moist.  Neck: No stridor.  Supple.  No JVD. Cardiovascular: Normal rate, regular rhythm. No murmurs, rubs or gallops.  Respiratory: Normal respiratory effort.  No accessory muscle use or retractions. Lungs CTAB.  No wheezes, rales or ronchi. Gastrointestinal: Soft, nontender and nondistended.  No guarding or rebound.  No peritoneal signs. Musculoskeletal: No LE edema. No ttp in the calves or palpable cords.  Negative Homan's sign. Neurologic:  A&Ox3.  Speech is clear.  Face and smile are symmetric.  EOMI.  Moves all extremities well. Skin:  Skin is warm, dry and intact. No rash noted. Psychiatric: Mood and affect are normal. Speech and behavior are normal.  Normal  judgement.  ____________________________________________   LABS (all labs ordered are listed, but only abnormal results are displayed)  Labs Reviewed  BASIC METABOLIC PANEL - Abnormal; Notable for the following:       Result Value   Glucose, Bld 104 (*)    All other components within normal limits  CBC  TROPONIN I  TROPONIN I   ____________________________________________  EKG  ED ECG REPORT I, Rockne Menghini, the attending physician, personally viewed and interpreted this ECG.   Date: 06/14/2017  EKG Time: 1731  Rate: 65  Rhythm: normal sinus rhythm  Axis: normal  Intervals:none  ST&T Change: No STEMI  ____________________________________________  RADIOLOGY  Dg Chest 2 View  Result Date: 06/14/2017 CLINICAL DATA:  Intermittent chest pain 2 days left-sided with radiation to left jaw. EXAM: CHEST  2 VIEW COMPARISON:  01/25/2011 FINDINGS: The heart size and mediastinal contours are within normal limits. Both lungs are clear. The visualized skeletal structures are unremarkable. IMPRESSION: No active cardiopulmonary disease. Electronically Signed   By: Elberta Fortis M.D.   On: 06/14/2017 19:18    ____________________________________________   PROCEDURES  Procedure(s) performed: None  Procedures  Critical Care performed: No ____________________________________________   INITIAL IMPRESSION / ASSESSMENT AND PLAN / ED COURSE  Pertinent labs & imaging results that were available during my care of the patient were reviewed by me and considered in my medical decision making (see chart for details).  41 y.o.Malewho is here with hypertension because he does not take his medications, presenting with 2 atypical chest pain episodes over the last week. Neither episode was exertional, but the patient does have risk factors for ACS or MI. At this time, his EKG does not show ischemic changes, and his first troponin is negative. Will get a second troponin. I will treat him  with aspirin. pE is or aortic pathology is very unlikely. He gives no signs or sat would be consistent with infection. If the patient's workup is reassuring in the emergency department and he continues to be chest pain-free, I will talk to the cardiologist on-call for outpatient risk stratification studies and follow up.  ----------------------------------------- 9:27 PM on 06/14/2017 -----------------------------------------  The patient's second troponin is negative. He is safe for discharge at [facility] with Dr. Gwen Pounds who will see the patient in clinic next week.   ____________________________________________  FINAL CLINICAL IMPRESSION(S) / ED DIAGNOSES  Final diagnoses:  Nonspecific chest pain  Essential hypertension         NEW MEDICATIONS STARTED DURING THIS VISIT:  New Prescriptions   LISINOPRIL (PRINIVIL,ZESTRIL) 20 MG TABLET    Take 1 tablet (20 mg total) by mouth daily.      Rockne Menghini, MD 06/14/17 2127

## 2017-06-14 NOTE — ED Triage Notes (Signed)
Patient presents to the ED with intermittent chest pain for the past 2 days.  Patient states it feels like heaviness to the left side of his chest that has occasionally radiated to his left jaw.  Patient reports being off his blood pressure medication since February.  Patient also reports some diaphoresis and states this morning he felt nauseous, was dry heaving, sneezed, and his nose started bleeding and patient states he coughed up a small amount of blood as well.  Patient is in no obvious distress at this time.

## 2017-06-14 NOTE — Discharge Instructions (Signed)
Please restart your blood pressure medication tomorrow and follow up with your primary care doctor for re-check.  Please call the cardiology appointment line for follow up for your chest pain.  Return to the emergency department for severe pain, shortness of breath, palpitations, lightheadedness or fainting, or for any other symptoms concerning to you.

## 2017-07-27 DIAGNOSIS — R591 Generalized enlarged lymph nodes: Secondary | ICD-10-CM | POA: Insufficient documentation

## 2017-07-27 DIAGNOSIS — L089 Local infection of the skin and subcutaneous tissue, unspecified: Secondary | ICD-10-CM | POA: Insufficient documentation

## 2017-07-27 NOTE — ED Triage Notes (Signed)
Patient reports noticed a "knot" behind his left ear last night, reports continued to notice area and now with swelling into left lower jaw. Reports having pain with this.  Patient with no respiratory distress noted.

## 2017-07-28 ENCOUNTER — Emergency Department
Admission: EM | Admit: 2017-07-28 | Discharge: 2017-07-28 | Disposition: A | Discharge status: Home / Self Care | Payer: Self-pay | Attending: Emergency Medicine | Admitting: Emergency Medicine

## 2017-07-28 DIAGNOSIS — L089 Local infection of the skin and subcutaneous tissue, unspecified: Secondary | ICD-10-CM

## 2017-07-28 DIAGNOSIS — R591 Generalized enlarged lymph nodes: Secondary | ICD-10-CM

## 2017-07-28 MED ORDER — CEPHALEXIN 500 MG PO CAPS: 500.0000 mg | ORAL_CAPSULE | Freq: Three times a day (TID) | ORAL | 0 refills | Status: DC

## 2017-07-28 MED ORDER — IBUPROFEN 800 MG PO TABS
800.0000 mg | ORAL_TABLET | Freq: Once | ORAL | Status: AC
  Administered 2017-07-28: 800 mg via ORAL
  Filled 2017-07-28: qty 1

## 2017-07-28 MED ORDER — IBUPROFEN 800 MG PO TABS: 800.0000 mg | ORAL_TABLET | Freq: Three times a day (TID) | ORAL | 0 refills | Status: DC | PRN

## 2017-07-28 MED ORDER — BACITRACIN ZINC 500 UNIT/GM EX OINT
TOPICAL_OINTMENT | Freq: Once | CUTANEOUS | Status: AC
  Administered 2017-07-28: 1 via TOPICAL
  Filled 2017-07-28: qty 0.9

## 2017-07-28 MED ORDER — HYDROCODONE-ACETAMINOPHEN 5-325 MG PO TABS
1.0000 | ORAL_TABLET | Freq: Once | ORAL | Status: AC
  Administered 2017-07-28: 1 via ORAL
  Filled 2017-07-28: qty 1

## 2017-07-28 MED ORDER — CEPHALEXIN 500 MG PO CAPS
500.0000 mg | ORAL_CAPSULE | Freq: Once | ORAL | Status: AC
  Administered 2017-07-28: 500 mg via ORAL
  Filled 2017-07-28: qty 1

## 2017-07-28 MED ORDER — LISINOPRIL 20 MG PO TABS: 20.0000 mg | ORAL_TABLET | Freq: Every day | ORAL | 0 refills | Status: DC

## 2017-07-28 NOTE — ED Provider Notes (Addendum)
Patient seen during computer downtime; see paper chart   Omar Dennis, [name], MD 07/28/17 0308  ----------------------------------------- 3:36 AM on 07/28/2017 -----------------------------------------  Patient ran out of his lisinopril 20 mg.  I have refilled his prescription and encouraged him to start it in the morning.    Omar Dennis, [name], MD 07/28/17 [phone]

## 2017-07-28 NOTE — Discharge Instructions (Signed)
1.  Apply Neosporin to top of right ear crease twice daily x 5 days. 2.  Take antibiotic as prescribed. 3.  You may take pain medicines as needed. 4.  Return to the ER for worsening symptoms, fever, persistent vomiting, difficulty breathing or other concerns.

## 2017-07-31 ENCOUNTER — Encounter: Payer: Self-pay | Admitting: Emergency Medicine

## 2017-07-31 ENCOUNTER — Emergency Department
Admission: EM | Admit: 2017-07-31 | Discharge: 2017-07-31 | Disposition: A | Discharge status: Home / Self Care | Payer: Self-pay | Attending: Emergency Medicine | Admitting: Emergency Medicine

## 2017-07-31 DIAGNOSIS — I10 Essential (primary) hypertension: Secondary | ICD-10-CM | POA: Insufficient documentation

## 2017-07-31 DIAGNOSIS — Z79899 Other long term (current) drug therapy: Secondary | ICD-10-CM | POA: Insufficient documentation

## 2017-07-31 DIAGNOSIS — F17210 Nicotine dependence, cigarettes, uncomplicated: Secondary | ICD-10-CM | POA: Insufficient documentation

## 2017-07-31 DIAGNOSIS — Z7982 Long term (current) use of aspirin: Secondary | ICD-10-CM | POA: Insufficient documentation

## 2017-07-31 DIAGNOSIS — Z760 Encounter for issue of repeat prescription: Secondary | ICD-10-CM | POA: Insufficient documentation

## 2017-07-31 LAB — BASIC METABOLIC PANEL
Anion gap: 7 (ref 5–15)
BUN: 8 mg/dL (ref 6–20)
CHLORIDE: 101 mmol/L (ref 101–111)
CO2: 29 mmol/L (ref 22–32)
CREATININE: 1.12 mg/dL (ref 0.61–1.24)
Calcium: 9.2 mg/dL (ref 8.9–10.3)
GFR calc Af Amer: >60 mL/min (ref >60)
GFR calc non Af Amer: >60 mL/min (ref >60)
GLUCOSE: 148 mg/dL — AB (ref 65–99)
Potassium: 4.3 mmol/L (ref 3.5–5.1)
SODIUM: 137 mmol/L (ref 135–145)

## 2017-07-31 MED ORDER — ACETAMINOPHEN 500 MG PO TABS
1000.0000 mg | ORAL_TABLET | Freq: Once | ORAL | Status: AC
  Administered 2017-07-31: 1000 mg via ORAL
  Filled 2017-07-31: qty 2

## 2017-07-31 MED ORDER — IBUPROFEN 800 MG PO TABS: 800.0000 mg | ORAL_TABLET | Freq: Three times a day (TID) | ORAL | 0 refills | Status: DC | PRN

## 2017-07-31 MED ORDER — LISINOPRIL 20 MG PO TABS: 20.0000 mg | ORAL_TABLET | Freq: Every day | ORAL | 1 refills | Status: DC

## 2017-07-31 NOTE — ED Provider Notes (Signed)
[facility] Emergency Department Provider Note  ____________________________________________   None    (approximate)  I have reviewed the triage vital signs and the nursing notes.   HISTORY  Chief Complaint Medication Refill   HPI Omar Dennis is a 41 y.o. male who presents to the emergency department for medication refill. Per his report, he was here a few days ago and only given a prescription for 5 pills. For the past 2 days he has had a headache and his body has been "jumping" at night while he is sleeping. He denies chest pain, shortness of breath, or blurred vision.    Past Medical History:  Diagnosis Date  . Hyperlipidemia   . Hypertension   . Stroke (cerebrum) [facility](HCC)     Patient Active Problem List   Diagnosis Date Noted  . Left-sided weakness 10/17/2016    History reviewed. No pertinent surgical history.  Prior to Admission medications   Medication Sig Start Date End Date Taking? Authorizing Provider  amLODipine (NORVASC) 10 MG tablet Take 1 tablet (10 mg total) by mouth daily. 10/19/16   Delfino LovettShah, Vipul, MD  aspirin 325 MG tablet Take 325 mg by mouth 2 (two) times daily.    [provider]  atorvastatin (LIPITOR) 40 MG tablet Take 1 tablet (40 mg total) by mouth daily at 6 PM. 10/18/16   Delfino LovettShah, Vipul, MD  cephALEXin (KEFLEX) 500 MG capsule Take 1 capsule (500 mg total) 3 (three) times daily by mouth. 07/28/17   Irean HongSung, Jade J, MD  GINSENG-GINKGO-B CMPLX-C-E-CA PO Take 2 tablets by mouth daily.    [provider]  ibuprofen (ADVIL,MOTRIN) 800 MG tablet Take 1 tablet (800 mg total) every 8 (eight) hours as needed by mouth for moderate pain. 07/31/17   Suella Cogar B, FNP  lisinopril (PRINIVIL,ZESTRIL) 20 MG tablet Take 1 tablet (20 mg total) daily by mouth. 07/31/17   Lakindra Wible, Rulon Eisenmengerari B, FNP  Omega-3 Fatty Acids (FISH OIL PO) Take 2 capsules by mouth daily.    [provider]    Allergies Codeine  Family History    Problem Relation Age of Onset  . Heart attack Father     Social History Social History   Tobacco Use  . Smoking status: Current Every Day Smoker    Packs/day: 0.50  . Smokeless tobacco: Never Used  Substance Use Topics  . Alcohol use: Yes    Comment: Occasionally  . Drug use: Yes    Types: Marijuana    Review of Systems  Constitutional: No fever/chills Eyes: No visual changes. Cardiovascular: Denies chest pain. Respiratory: Denies shortness of breath. Gastrointestinal: No abdominal pain.  Musculoskeletal: Negative for back pain. Skin: Negative for rash, lesion, or wound. Neurological: Positive for headaches, negative for focal weakness or numbness.  ____________________________________________   PHYSICAL EXAM:  VITAL SIGNS: ED Triage Vitals  Enc Vitals Group     BP 07/31/17 1447 (!) 191/109     Pulse Rate 07/31/17 1447 60     Resp 07/31/17 1447 16     Temp 07/31/17 1447 98.2 F (36.8 C)     Temp Source 07/31/17 1447 Oral     SpO2 07/31/17 1447 99 %     Weight 07/31/17 1448 196 lb (88.9 kg)     Height 07/31/17 1448 5\' 9"  (1.753 m)     Head Circumference --      Peak Flow --      Pain Score --      Pain Loc --  Pain Edu? --      Excl. in GC? --     Constitutional: Alert and oriented. Well appearing and in no acute distress. Eyes: Conjunctivae are normal. Head: Atraumatic. Mouth/Throat: Mucous membranes are moist.  Oropharynx non-erythematous. Neck: No stridor.   Cardiovascular: Normal rate, regular rhythm. Grossly normal heart sounds.  Good peripheral circulation. Respiratory: Normal respiratory effort.  No retractions. Lungs CTAB. Gastrointestinal: Soft and nontender. No distention. No abdominal bruits. No CVA tenderness. Musculoskeletal: No lower extremity tenderness nor edema. Neurologic:  Normal speech and language. No gross focal neurologic deficits are appreciated. No gait instability. Skin:  Skin is warm, dry and intact. No rash  noted. Psychiatric: Mood and affect are normal. Speech and behavior are normal.  ____________________________________________   LABS (all labs ordered are listed, but only abnormal results are displayed)  Labs Reviewed  BASIC METABOLIC PANEL - Abnormal; Notable for the following components:      Result Value   Glucose, Bld 148 (*)    All other components within normal limits   ____________________________________________  EKG  Not indicated. ____________________________________________  RADIOLOGY  No results found.  ____________________________________________   PROCEDURES  Procedure(s) performed: None  Procedures  Critical Care performed: No  ____________________________________________   INITIAL IMPRESSION / ASSESSMENT AND PLAN / ED COURSE  As part of my medical decision making, I reviewed the following data within the electronic MEDICAL RECORD NUMBER    41 year old male presenting to the emergency department for medication refill. BMP shows nothing of concern today. He was instructed to follow up with a primary care provider and given a list of community resources. He'll be given a prescription of lisinopril with 1 refill and advised that he needs to take the medication only as prescribed. He was encouraged to check his blood pressure often and log the readings to show the primary care provider when he schedules an appointment. ____________________________________________   FINAL CLINICAL IMPRESSION(S) / ED DIAGNOSES  Final diagnoses:  Medication refill  Hypertension, unspecified type     ED Discharge Orders        Ordered    ibuprofen (ADVIL,MOTRIN) 800 MG tablet  Every 8 hours PRN     07/31/17 1552    lisinopril (PRINIVIL,ZESTRIL) 20 MG tablet  Daily     07/31/17 1552       Note:  This document was prepared using Dragon voice recognition software and may include unintentional dictation errors.    Chinita Pesterriplett, Ramonita Koenig B, FNP 07/31/17 1607     Dionne BucySiadecki, Sebastian, MD 07/31/17 [phone]

## 2017-07-31 NOTE — Discharge Instructions (Signed)
Please establish a primary care doctor. Take your blood pressure medication daily as prescribed. Return to the emergency department for symptoms of concern if you are unable to schedule an appointment with primary care.

## 2017-07-31 NOTE — ED Triage Notes (Signed)
Patient presents to the ED with hypertension.  Patient states, "I think my blood pressure is high because I broke out in a sweat earlier".  Patient denies dizziness or blurry vision.  Patient reports headache yesterday that resolved somewhat today.  Patient states he was recently seen in the ED for the same and prescribed a new blood pressure medication.  Patient states, "They were supposed to give me a 30 day supply but they actually only gave me 5."  Patient is in no obvious distress at this time.

## 2017-07-31 NOTE — ED Notes (Addendum)
PT needs medication refilled r/t to BP. Pt states he takes Lisinopril 20 mg PO daily.

## 2018-04-01 ENCOUNTER — Other Ambulatory Visit: Payer: Self-pay

## 2018-04-01 ENCOUNTER — Emergency Department
Admission: EM | Admit: 2018-04-01 | Discharge: 2018-04-01 | Disposition: A | Discharge status: Home / Self Care | Payer: Self-pay | Attending: Emergency Medicine | Admitting: Emergency Medicine

## 2018-04-01 ENCOUNTER — Encounter: Payer: Self-pay | Admitting: Emergency Medicine

## 2018-04-01 DIAGNOSIS — I10 Essential (primary) hypertension: Secondary | ICD-10-CM | POA: Insufficient documentation

## 2018-04-01 DIAGNOSIS — Z7982 Long term (current) use of aspirin: Secondary | ICD-10-CM | POA: Insufficient documentation

## 2018-04-01 DIAGNOSIS — R21 Rash and other nonspecific skin eruption: Secondary | ICD-10-CM | POA: Insufficient documentation

## 2018-04-01 DIAGNOSIS — F17200 Nicotine dependence, unspecified, uncomplicated: Secondary | ICD-10-CM | POA: Insufficient documentation

## 2018-04-01 DIAGNOSIS — Z79899 Other long term (current) drug therapy: Secondary | ICD-10-CM | POA: Insufficient documentation

## 2018-04-01 LAB — GLUCOSE, CAPILLARY: Glucose-Capillary: 123 mg/dL — ABNORMAL HIGH (ref 70–99)

## 2018-04-01 MED ORDER — METHYLPREDNISOLONE SODIUM SUCC 125 MG IJ SOLR
80.0000 mg | Freq: Once | INTRAMUSCULAR | Status: AC
  Administered 2018-04-01: 80 mg via INTRAMUSCULAR
  Filled 2018-04-01: qty 2

## 2018-04-01 MED ORDER — HYDROXYZINE HCL 50 MG PO TABS
50.0000 mg | ORAL_TABLET | Freq: Once | ORAL | Status: AC
  Administered 2018-04-01: 50 mg via ORAL
  Filled 2018-04-01: qty 1

## 2018-04-01 MED ORDER — METHYLPREDNISOLONE 4 MG PO TBPK: ORAL_TABLET | ORAL | 0 refills | Status: DC

## 2018-04-01 MED ORDER — HYDROXYZINE HCL 50 MG PO TABS: 50.0000 mg | ORAL_TABLET | Freq: Three times a day (TID) | ORAL | 0 refills | Status: DC | PRN

## 2018-04-01 NOTE — ED Provider Notes (Signed)
[facility] Emergency Department Provider Note   ____________________________________________   First MD Initiated Contact with Patient 04/01/18 1424     (approximate)  I have reviewed the triage vital signs and the nursing notes.   HISTORY  Chief Complaint Rash    HPI Omar Dennis is a 42 y.o. male patient complaining of a rash that began last night.  Patient stated rashes on the right side of his body.  Patient denies change of foods, medicine, or personal hygiene items.  Patient stated rash itch.  No palliative measures for complaint.  It was noted patient blood pressure is elevated but he said he did not take medication today.  Past Medical History:  Diagnosis Date  . Hyperlipidemia   . Hypertension   . Stroke (cerebrum) [facility](HCC)     Patient Active Problem List   Diagnosis Date Noted  . Left-sided weakness 10/17/2016    History reviewed. No pertinent surgical history.  Prior to Admission medications   Medication Sig Start Date End Date Taking? Authorizing Provider  amLODipine (NORVASC) 10 MG tablet Take 1 tablet (10 mg total) by mouth daily. 10/19/16   Delfino LovettShah, Vipul, MD  aspirin 325 MG tablet Take 325 mg by mouth 2 (two) times daily.    [provider]  atorvastatin (LIPITOR) 40 MG tablet Take 1 tablet (40 mg total) by mouth daily at 6 PM. 10/18/16   Delfino LovettShah, Vipul, MD  cephALEXin (KEFLEX) 500 MG capsule Take 1 capsule (500 mg total) 3 (three) times daily by mouth. 07/28/17   Irean HongSung, Jade J, MD  GINSENG-GINKGO-B CMPLX-C-E-CA PO Take 2 tablets by mouth daily.    [provider]  hydrOXYzine (ATARAX/VISTARIL) 50 MG tablet Take 1 tablet (50 mg total) by mouth 3 (three) times daily as needed for itching. 04/01/18   Joni ReiningSmith, Daelan Gatt K, PA-C  ibuprofen (ADVIL,MOTRIN) 800 MG tablet Take 1 tablet (800 mg total) every 8 (eight) hours as needed by mouth for moderate pain. 07/31/17   Triplett, Cari B, FNP  lisinopril (PRINIVIL,ZESTRIL) 20 MG tablet Take  1 tablet (20 mg total) daily by mouth. 07/31/17   Kem Boroughsriplett, Cari B, FNP  methylPREDNISolone (MEDROL DOSEPAK) 4 MG TBPK tablet Take Tapered dose as directed 04/01/18   Joni ReiningSmith, Betsy Rosello K, PA-C  Omega-3 Fatty Acids (FISH OIL PO) Take 2 capsules by mouth daily.    [provider]    Allergies Codeine  Family History  Problem Relation Age of Onset  . Heart attack Father     Social History Social History   Tobacco Use  . Smoking status: Current Every Day Smoker    Packs/day: 0.50  . Smokeless tobacco: Never Used  Substance Use Topics  . Alcohol use: Yes    Comment: Occasionally  . Drug use: Yes    Types: Marijuana    Review of Systems  Constitutional: No fever/chills Eyes: No visual changes. ENT: No sore throat. Cardiovascular: Denies chest pain. Respiratory: Denies shortness of breath. Gastrointestinal: No abdominal pain.  No nausea, no vomiting.  No diarrhea.  No constipation. Genitourinary: Negative for dysuria. Musculoskeletal: Negative for back pain. Skin: Positive for rash. Neurological: Negative for headaches, focal weakness or numbness. Endocrine:Hyperlipidemia and hypertension. Allergic/Immunilogical: Codeine. ____________________________________________   PHYSICAL EXAM:  VITAL SIGNS: ED Triage Vitals  Enc Vitals Group     BP 04/01/18 1358 (!) 174/120     Pulse Rate 04/01/18 1358 (!) 59     Resp --      Temp 04/01/18 1358 98.7 F (  37.1 C)     Temp Source 04/01/18 1358 Oral     SpO2 04/01/18 1358 100 %     Weight 04/01/18 1400 200 lb (90.7 kg)     Height 04/01/18 1400 5\' 9"  (1.753 m)     Head Circumference --      Peak Flow --      Pain Score 04/01/18 1400 0     Pain Loc --      Pain Edu? --      Excl. in GC? --    Constitutional: Alert and oriented. Well appearing and in no acute distress. Neck: No stridor.  No cervical spine tenderness to palpation. Hematological/Lymphatic/Immunilogical: No cervical lymphadenopathy. Cardiovascular: Normal  rate, regular rhythm. Grossly normal heart sounds.  Good peripheral circulation. Respiratory: Normal respiratory effort.  No retractions. Lungs CTAB. Gastrointestinal: Soft and nontender. No distention. No abdominal bruits. No CVA tenderness. Musculoskeletal: No lower extremity tenderness nor edema.  No joint effusions. Neurologic:  Normal speech and language. No gross focal neurologic deficits are appreciated. No gait instability. Skin:  Skin is warm, dry and intact.  Macular lesions right lower and upper extremities.   Psychiatric: Mood and affect are normal. Speech and behavior are normal.  ____________________________________________   LABS (all labs ordered are listed, but only abnormal results are displayed)  Labs Reviewed  GLUCOSE, CAPILLARY - Abnormal; Notable for the following components:      Result Value   Glucose-Capillary 123 (*)    All other components within normal limits  CBG MONITORING, ED   ____________________________________________  EKG   ____________________________________________  RADIOLOGY  ED MD interpretation:    Official radiology report(s): No results found.  ____________________________________________   PROCEDURES  Procedure(s) performed: None  Procedures  Critical Care performed: No  ____________________________________________   INITIAL IMPRESSION / ASSESSMENT AND PLAN / ED COURSE  As part of my medical decision making, I reviewed the following data within the electronic MEDICAL RECORD NUMBER    Rash secondary to contact dermatitis.  Patient given discharge care instructions.  Patient given a prescription for Atarax and a Medrol Dosepak.  [facility]      ____________________________________________   FINAL CLINICAL IMPRESSION(S) / ED DIAGNOSES  Final diagnoses:  Rash and nonspecific skin eruption     ED Discharge Orders        Ordered    methylPREDNISolone (MEDROL  DOSEPAK) 4 MG TBPK tablet     04/01/18 1444    hydrOXYzine (ATARAX/VISTARIL) 50 MG tablet  3 times daily PRN     04/01/18 1444       Note:  This document was prepared using Dragon voice recognition software and may include unintentional dictation errors.    Joni Reining, PA-C 04/01/18 1516    Jene Every, MD 04/04/18 [phone]

## 2018-04-01 NOTE — ED Triage Notes (Signed)
Pt with rash that started last night on abd. No airway issues at this time. Pt denies changes in meds, foods, washing, bathing items.

## 2018-06-12 ENCOUNTER — Emergency Department
Admission: EM | Admit: 2018-06-12 | Discharge: 2018-06-12 | Disposition: A | Discharge status: Home / Self Care | Payer: Self-pay | Attending: Emergency Medicine | Admitting: Emergency Medicine

## 2018-06-12 ENCOUNTER — Other Ambulatory Visit: Payer: Self-pay

## 2018-06-12 DIAGNOSIS — Z79899 Other long term (current) drug therapy: Secondary | ICD-10-CM | POA: Insufficient documentation

## 2018-06-12 DIAGNOSIS — I10 Essential (primary) hypertension: Secondary | ICD-10-CM | POA: Insufficient documentation

## 2018-06-12 DIAGNOSIS — F17210 Nicotine dependence, cigarettes, uncomplicated: Secondary | ICD-10-CM | POA: Insufficient documentation

## 2018-06-12 DIAGNOSIS — Z8673 Personal history of transient ischemic attack (TIA), and cerebral infarction without residual deficits: Secondary | ICD-10-CM | POA: Insufficient documentation

## 2018-06-12 DIAGNOSIS — L509 Urticaria, unspecified: Secondary | ICD-10-CM | POA: Insufficient documentation

## 2018-06-12 MED ORDER — HYDROXYZINE HCL 25 MG PO TABS: 25.0000 mg | ORAL_TABLET | Freq: Three times a day (TID) | ORAL | 0 refills | Status: AC | PRN

## 2018-06-12 MED ORDER — HYDROXYZINE HCL 25 MG PO TABS
25.0000 mg | ORAL_TABLET | Freq: Once | ORAL | Status: AC
  Administered 2018-06-12: 25 mg via ORAL
  Filled 2018-06-12: qty 1

## 2018-06-12 MED ORDER — LISINOPRIL 10 MG PO TABS
20.0000 mg | ORAL_TABLET | Freq: Once | ORAL | Status: AC
  Administered 2018-06-12: 20 mg via ORAL
  Filled 2018-06-12: qty 2

## 2018-06-12 MED ORDER — PREDNISONE 10 MG (21) PO TBPK: ORAL_TABLET | ORAL | 0 refills | Status: DC

## 2018-06-12 MED ORDER — LORATADINE 10 MG PO CAPS: 1.0000 | ORAL_CAPSULE | Freq: Every day | ORAL | 12 refills | Status: AC

## 2018-06-12 MED ORDER — LISINOPRIL 20 MG PO TABS: 20.0000 mg | ORAL_TABLET | Freq: Every day | ORAL | 3 refills | Status: AC

## 2018-06-12 NOTE — ED Provider Notes (Signed)
[facility] Emergency Department Provider Note  ____________________________________________   None    (approximate)  I have reviewed the triage vital signs and the nursing notes.   HISTORY  Chief Complaint Rash      HPI Omar Dennis is a 42 y.o. male presents emergency department complaining of hives all over.  He states he is unsure of what he is allergic to.  He states that he had same symptoms couple of months ago in which steroids help.  Now the itching has returned and he is unsure of why.  He is not taking any new medications.  In fact he stopped taking his lisinopril more than 2 months ago.  The only change in his daily routine is been a new toothpaste.  He denies chest pain or shortness of breath.  Denies throat swelling.  He denies difficulty breathing   Past Medical History:  Diagnosis Date  . Hyperlipidemia   . Hypertension   . Stroke (cerebrum) [facility])     Patient Active Problem List   Diagnosis Date Noted  . Left-sided weakness 10/17/2016    History reviewed. No pertinent surgical history.  Prior to Admission medications   Medication Sig Start Date End Date Taking? Authorizing Provider  GINSENG-GINKGO-B CMPLX-C-E-CA PO Take 2 tablets by mouth daily.    [provider]  hydrOXYzine (ATARAX/VISTARIL) 25 MG tablet Take 1 tablet (25 mg total) by mouth 3 (three) times daily as needed. 06/12/18   Fisher, Roselyn Bering, PA-C  ibuprofen (ADVIL,MOTRIN) 800 MG tablet Take 1 tablet (800 mg total) every 8 (eight) hours as needed by mouth for moderate pain. 07/31/17   Triplett, Cari B, FNP  lisinopril (PRINIVIL,ZESTRIL) 20 MG tablet Take 1 tablet (20 mg total) by mouth daily. 06/12/18   Fisher, Roselyn Bering, PA-C  Loratadine 10 MG CAPS Take 1 capsule (10 mg total) by mouth daily. 06/12/18   Fisher, Roselyn Bering, PA-C  Omega-3 Fatty Acids (FISH OIL PO) Take 2 capsules by mouth daily.    [provider]  predniSONE (STERAPRED UNI-PAK 21 TAB) 10 MG  (21) TBPK tablet Take 6 pills on day one then decrease by 1 pill each day 06/12/18   Faythe Ghee, PA-C    Allergies Codeine  Family History  Problem Relation Age of Onset  . Heart attack Father     Social History Social History   Tobacco Use  . Smoking status: Current Every Day Smoker    Packs/day: 0.50  . Smokeless tobacco: Never Used  Substance Use Topics  . Alcohol use: Yes    Comment: Occasionally  . Drug use: Yes    Types: Marijuana    Review of Systems  Constitutional: No fever/chills Eyes: No visual changes. ENT: No sore throat. Respiratory: Denies cough Genitourinary: Negative for dysuria. Musculoskeletal: Negative for back pain. Skin: Positive for itching    ____________________________________________   PHYSICAL EXAM:  VITAL SIGNS: ED Triage Vitals  Enc Vitals Group     BP 06/12/18 1401 (!) 202/113     Pulse Rate 06/12/18 1401 82     Resp 06/12/18 1511 (!) 21     Temp 06/12/18 1401 98.1 F (36.7 C)     Temp Source 06/12/18 1401 Oral     SpO2 06/12/18 1401 97 %     Weight 06/12/18 1402 200 lb (90.7 kg)     Height 06/12/18 1402 5\' 9"  (1.753 m)     Head Circumference --      Peak Flow --  Pain Score 06/12/18 1407 0     Pain Loc --      Pain Edu? --      Excl. in GC? --     Constitutional: Alert and oriented. Well appearing and in no acute distress. Eyes: Conjunctivae are normal.  Head: Atraumatic. Nose: No congestion/rhinnorhea. Mouth/Throat: Mucous membranes are moist.  Throat appears normal Neck:  supple no lymphadenopathy noted Cardiovascular: Normal rate, regular rhythm. Heart sounds are normal Respiratory: Normal respiratory effort.  No retractions, lungs c t a  GU: deferred Musculoskeletal: FROM all extremities, warm and well perfused Neurologic:  Normal speech and language.  Skin:  Skin is warm, dry and intact.  Positive for hives on the trunk hands and arms.  No other lesions are noted.Marland Kitchen. Psychiatric: Mood and affect are  normal. Speech and behavior are normal.  ____________________________________________   LABS (all labs ordered are listed, but only abnormal results are displayed)  Labs Reviewed - No data to display ____________________________________________   ____________________________________________  RADIOLOGY    ____________________________________________   PROCEDURES  Procedure(s) performed: No  Procedures    ____________________________________________   INITIAL IMPRESSION / ASSESSMENT AND PLAN / ED COURSE  Pertinent labs & imaging results that were available during my care of the patient were reviewed by me and considered in my medical decision making (see chart for details).   Patient is 42 year old male presents emergency department complaining of hives and itching.  He states he had same symptoms previously 2 months ago which steroids out.  He is unsure of any new exposures other than a new toothpaste.  On physical exam patient appears well.  Airways patent.  Lungs clear to auscultation.  There are hives noted on the trunk and extremities.  Discussed findings with patient.  He was given lisinopril and Atarax while here in the emergency department.  He is to follow-up with his regular doctor for his blood pressure concerns.  Follow-up with Dr. [place] CallasSharma for allergy testing.  He was given a prescription for Sterapred, lisinopril, Atarax, and Claritin.  He is to take these medications as instructed.  Return emergency department worsening.  States he understands comply with our instructions.  He was discharged in stable condition      As part of my medical decision making, I reviewed the following data within the electronic MEDICAL RECORD NUMBER Nursing notes reviewed and incorporated, Notes from prior ED visits and [place] Controlled Substance Database  ____________________________________________   FINAL CLINICAL IMPRESSION(S) / ED DIAGNOSES  Final diagnoses:  Urticaria    Essential hypertension      NEW MEDICATIONS STARTED DURING THIS VISIT:  New Prescriptions   HYDROXYZINE (ATARAX/VISTARIL) 25 MG TABLET    Take 1 tablet (25 mg total) by mouth 3 (three) times daily as needed.   LISINOPRIL (PRINIVIL,ZESTRIL) 20 MG TABLET    Take 1 tablet (20 mg total) by mouth daily.   LORATADINE 10 MG CAPS    Take 1 capsule (10 mg total) by mouth daily.   PREDNISONE (STERAPRED UNI-PAK 21 TAB) 10 MG (21) TBPK TABLET    Take 6 pills on day one then decrease by 1 pill each day     Note:  This document was prepared using Dragon voice recognition software and may include unintentional dictation errors.    Faythe GheeFisher, Susan W, PA-C 06/12/18 1521    Schaevitz, Myra Rudeavid Matthew, MD 06/12/18 [phone]

## 2018-06-12 NOTE — ED Notes (Signed)
See triage note  States he has had intermittent rash and itching since yesterday  Hx of same in past but not sure what causes sx's  No resp distress noted  Also states he has been out of his lisinopril for about 2 months

## 2018-06-12 NOTE — ED Triage Notes (Signed)
Pt c/o itchy rash all over since yesterday. States he has intermittent issue with allergies throughout the years and this feels the same

## 2018-06-12 NOTE — Discharge Instructions (Signed)
Follow-up with your regular doctor or the acute care in 3 to 5 days if not better.  Take the medication as prescribed.  Call Dr. [place] CallasSharma for an appointment.  Go back to your original toothpaste and see if this solves problem.  If they do not need to see Dr. [place] CallasSharma.  Take your blood pressure daily.  It is important for you to take your blood pressure as your blood pressure today is 202/113

## 2018-09-09 IMAGING — CR DG CHEST 2V
1 series · 2 of 2 positions shown · non-contrast
Comparison: 01/25/2011

CLINICAL DATA: Intermittent chest pain 2 days left-sided with
radiation to left jaw.

EXAM:
CHEST  2 VIEW

[Series 1: dg chest 2 view · 0.14mm/px · 2 of 2 slices shown]
[im 1/2]
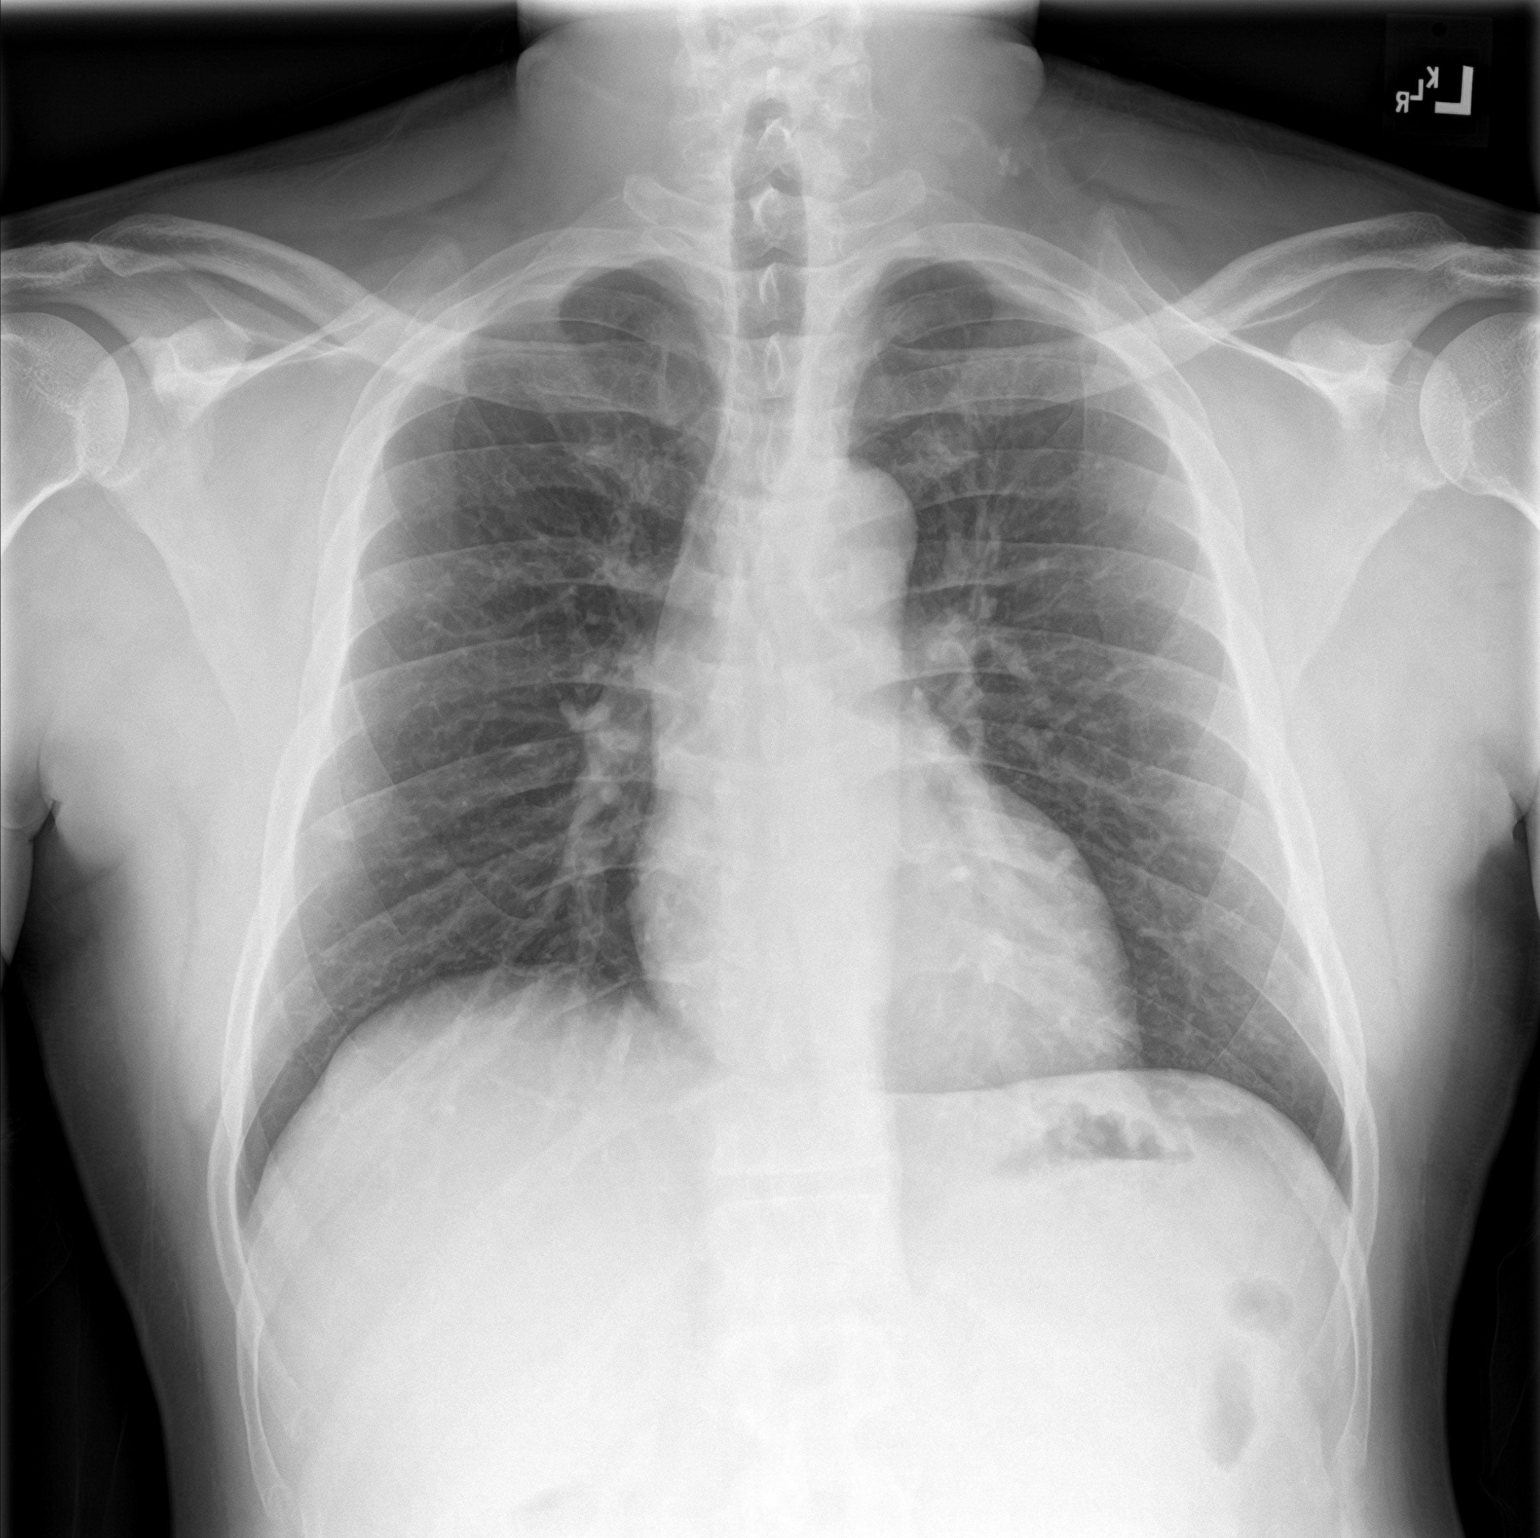
[im 2/2]
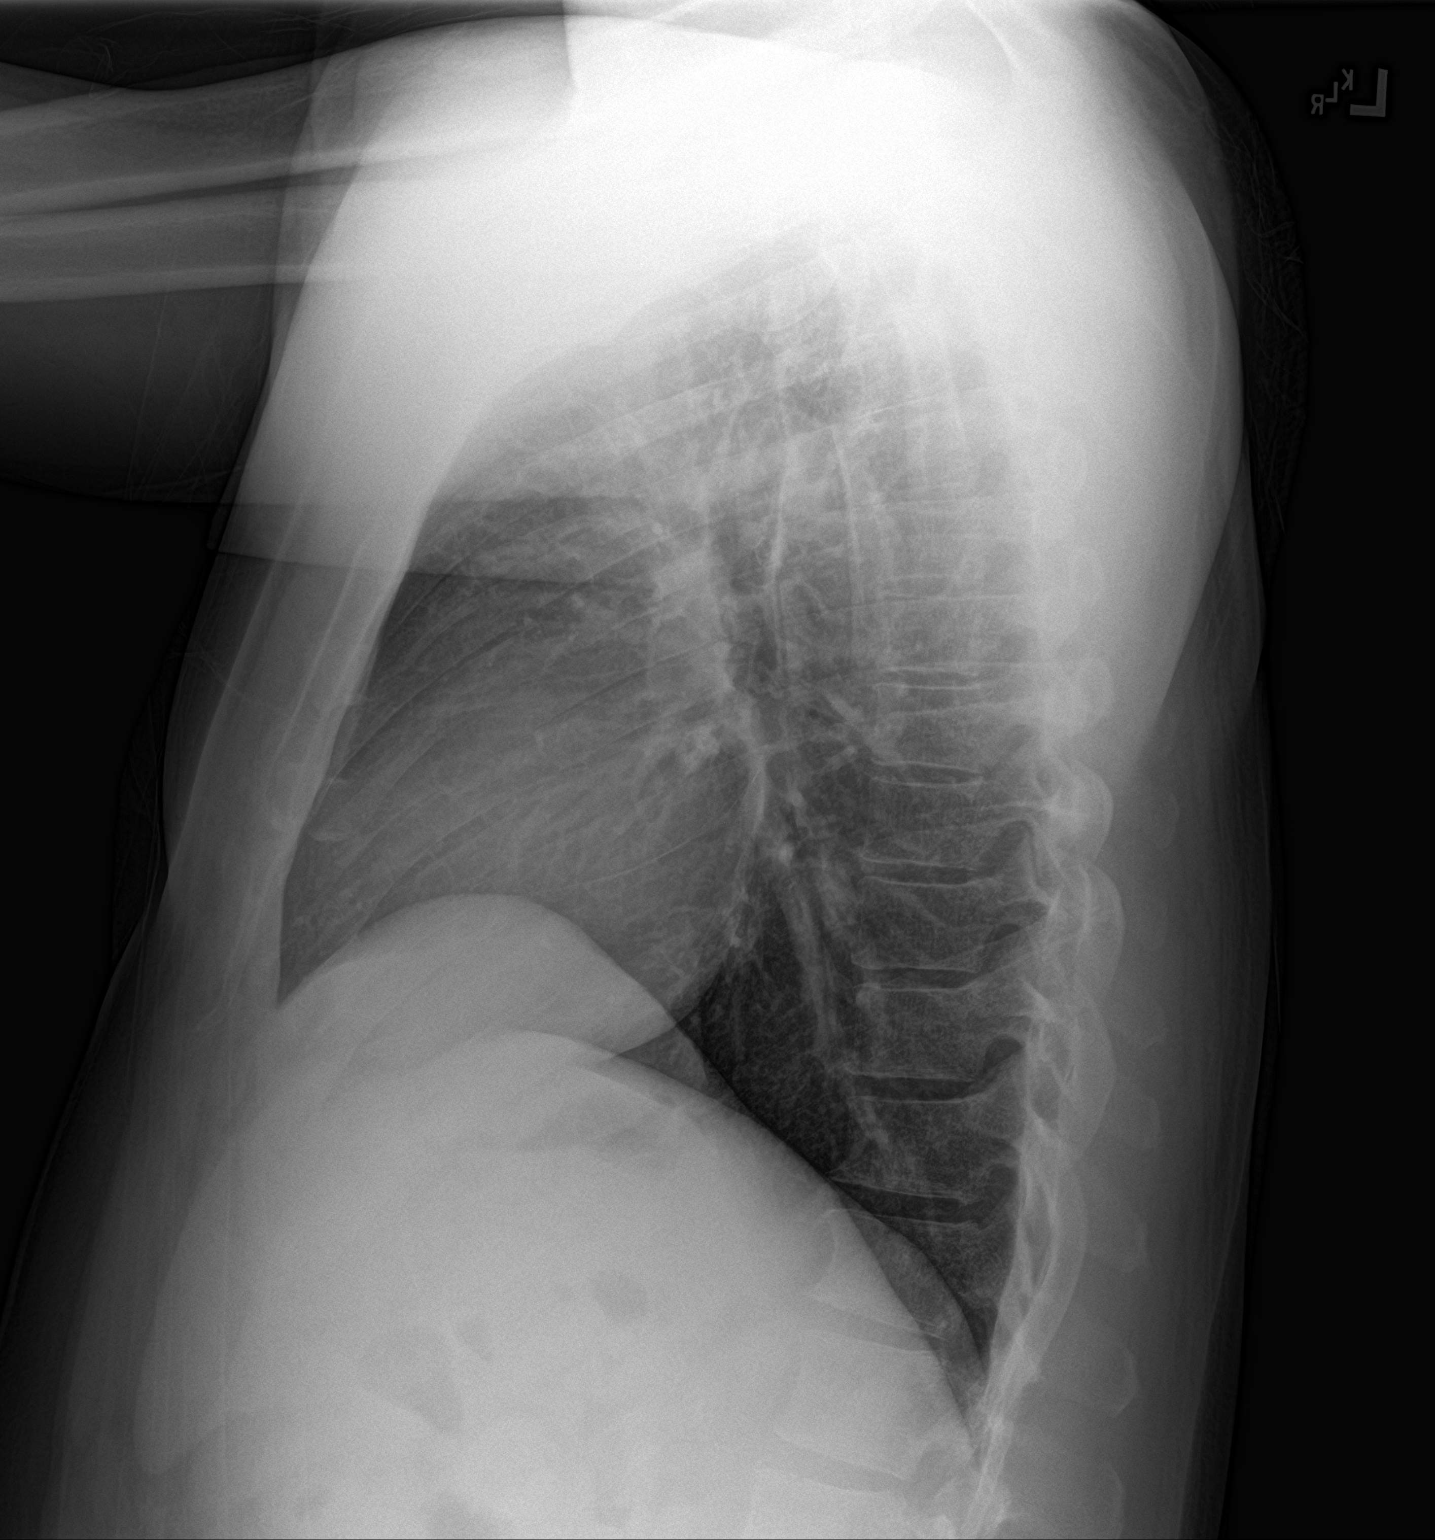

[2 of 2 positions shown; findings below may reference images not displayed]

FINDINGS: The heart size and mediastinal contours are within normal limits.
Both lungs are clear. The visualized skeletal structures are
unremarkable.
IMPRESSION: No active cardiopulmonary disease.

## 2021-03-31 ENCOUNTER — Emergency Department
Admission: EM | Admit: 2021-03-31 | Discharge: 2021-03-31 | Disposition: A | Discharge status: Home / Self Care | Payer: Self-pay | Attending: Emergency Medicine | Admitting: Emergency Medicine

## 2021-03-31 ENCOUNTER — Other Ambulatory Visit: Payer: Self-pay

## 2021-03-31 DIAGNOSIS — Z79899 Other long term (current) drug therapy: Secondary | ICD-10-CM | POA: Insufficient documentation

## 2021-03-31 DIAGNOSIS — R112 Nausea with vomiting, unspecified: Secondary | ICD-10-CM | POA: Insufficient documentation

## 2021-03-31 DIAGNOSIS — F17210 Nicotine dependence, cigarettes, uncomplicated: Secondary | ICD-10-CM | POA: Insufficient documentation

## 2021-03-31 DIAGNOSIS — I10 Essential (primary) hypertension: Secondary | ICD-10-CM | POA: Insufficient documentation

## 2021-03-31 LAB — URINALYSIS, COMPLETE (UACMP) WITH MICROSCOPIC
Bacteria, UA: NONE SEEN
Bilirubin Urine: NEGATIVE
Glucose, UA: 50 mg/dL — AB
Ketones, ur: 5 mg/dL — AB
Leukocytes,Ua: NEGATIVE
Nitrite: NEGATIVE
Protein, ur: NEGATIVE mg/dL
Specific Gravity, Urine: 1.025 (ref 1.005–1.030)
pH: 5 (ref 5.0–8.0)

## 2021-03-31 LAB — COMPREHENSIVE METABOLIC PANEL
ALT: 13 U/L (ref 0–44)
AST: 19 U/L (ref 15–41)
Albumin: 4.2 g/dL (ref 3.5–5.0)
Alkaline Phosphatase: 64 U/L (ref 38–126)
Anion gap: 9 (ref 5–15)
BUN: 11 mg/dL (ref 6–20)
CO2: 26 mmol/L (ref 22–32)
Calcium: 9.3 mg/dL (ref 8.9–10.3)
Chloride: 97 mmol/L — ABNORMAL LOW (ref 98–111)
Creatinine, Ser: 0.99 mg/dL (ref 0.61–1.24)
GFR, Estimated: >60 mL/min (ref >60)
Glucose, Bld: 272 mg/dL — ABNORMAL HIGH (ref 70–99)
Potassium: 3.7 mmol/L (ref 3.5–5.1)
Sodium: 132 mmol/L — ABNORMAL LOW (ref 135–145)
Total Bilirubin: 0.7 mg/dL (ref 0.3–1.2)
Total Protein: 8 g/dL (ref 6.5–8.1)

## 2021-03-31 LAB — CBC
HCT: 45.2 % (ref 39.0–52.0)
Hemoglobin: 15.3 g/dL (ref 13.0–17.0)
MCH: 28.9 pg (ref 26.0–34.0)
MCHC: 33.8 g/dL (ref 30.0–36.0)
MCV: 85.4 fL (ref 80.0–100.0)
Platelets: 248 10*3/uL (ref 150–400)
RBC: 5.29 MIL/uL (ref 4.22–5.81)
RDW: 13 % (ref 11.5–15.5)
WBC: 7.8 10*3/uL (ref 4.0–10.5)
nRBC: 0 % (ref 0.0–0.2)

## 2021-03-31 LAB — CBG MONITORING, ED: Glucose-Capillary: 241 mg/dL — ABNORMAL HIGH (ref 70–99)

## 2021-03-31 LAB — LIPASE, BLOOD: Lipase: 29 U/L (ref 11–51)

## 2021-03-31 MED ORDER — METOCLOPRAMIDE HCL 10 MG PO TABS: 10.0000 mg | ORAL_TABLET | Freq: Four times a day (QID) | ORAL | 0 refills | Status: AC | PRN

## 2021-03-31 MED ORDER — ONDANSETRON 4 MG PO TBDP: 4.0000 mg | ORAL_TABLET | Freq: Three times a day (TID) | ORAL | 0 refills | Status: DC | PRN

## 2021-03-31 MED ORDER — SODIUM CHLORIDE 0.9 % IV BOLUS
1000.0000 mL | Freq: Once | INTRAVENOUS | Status: AC
  Administered 2021-03-31: 1000 mL via INTRAVENOUS

## 2021-03-31 MED ORDER — METOCLOPRAMIDE HCL 5 MG/ML IJ SOLN
10.0000 mg | Freq: Once | INTRAMUSCULAR | Status: AC
  Administered 2021-03-31: 10 mg via INTRAVENOUS
  Filled 2021-03-31: qty 2

## 2021-03-31 NOTE — ED Triage Notes (Signed)
Pt to ER via POV. Pt reports for the last week he has been having diarrhea, nausea, and chills. States every time he tries to eat he has been dizzy and unable to keep anything down. Reports some generalized abdominal pain.   Reports taking meds as prescribed but has been unable to check his blood sugar because he is out of strips.

## 2021-03-31 NOTE — ED Provider Notes (Signed)
[facility] Emergency Department Provider Note    ____________________________________________   I have reviewed the triage vital signs and the nursing notes.   HISTORY  Chief Complaint Emesis   History limited by: Not Limited   HPI Omar Dennis is a 45 y.o. male who presents to the emergency department today because of concerns for nausea and vomiting.  Patient states symptoms have been going on for the past week.  They typically occur when he tries to eat although it has happened at other times as well.  States he will get a flushed and warm feeling and then will vomit.  He has not noticed any change in his bowel movements over the past week.  Denies any measured fevers.  Denies any unusual ingestions or sick contacts prior to the symptoms starting.  Denies any history of abdominal issues in the past.   Records reviewed. Per medical record review patient has a history of HLD, HTN.  Past Medical History:  Diagnosis Date   Hyperlipidemia    Hypertension    Stroke (cerebrum) [facility])     Patient Active Problem List   Diagnosis Date Noted   Left-sided weakness 10/17/2016    History reviewed. No pertinent surgical history.  Prior to Admission medications   Medication Sig Start Date End Date Taking? Authorizing Provider  GINSENG-GINKGO-B CMPLX-C-E-CA PO Take 2 tablets by mouth daily.    [provider]  hydrOXYzine (ATARAX/VISTARIL) 25 MG tablet Take 1 tablet (25 mg total) by mouth 3 (three) times daily as needed. 06/12/18   Fisher, Roselyn Bering, PA-C  ibuprofen (ADVIL,MOTRIN) 800 MG tablet Take 1 tablet (800 mg total) every 8 (eight) hours as needed by mouth for moderate pain. 07/31/17   Triplett, Cari B, FNP  lisinopril (PRINIVIL,ZESTRIL) 20 MG tablet Take 1 tablet (20 mg total) by mouth daily. 06/12/18   Fisher, Roselyn Bering, PA-C  Loratadine 10 MG CAPS Take 1 capsule (10 mg total) by mouth daily. 06/12/18   Fisher, Roselyn Bering, PA-C  Omega-3 Fatty Acids (FISH  OIL PO) Take 2 capsules by mouth daily.    [provider]  predniSONE (STERAPRED UNI-PAK 21 TAB) 10 MG (21) TBPK tablet Take 6 pills on day one then decrease by 1 pill each day 06/12/18   Faythe Ghee, PA-C    Allergies Codeine  Family History  Problem Relation Age of Onset   Heart attack Father     Social History Social History   Tobacco Use   Smoking status: Every Day    Packs/day: 0.50    Pack years: 0.00    Types: Cigarettes   Smokeless tobacco: Never  Substance Use Topics   Alcohol use: Yes    Comment: Occasionally   Drug use: Yes    Types: Marijuana    Review of Systems Constitutional: No fever/chills Eyes: No visual changes. ENT: No sore throat. Cardiovascular: Denies chest pain. Respiratory: Denies shortness of breath. Gastrointestinal: Positive for nausea and vomiting.    Genitourinary: Negative for dysuria. Musculoskeletal: Negative for back pain. Skin: Negative for rash. Neurological: Negative for headaches, focal weakness or numbness.  ____________________________________________   PHYSICAL EXAM:  VITAL SIGNS: ED Triage Vitals [03/31/21 1458]  Enc Vitals Group     BP (!) 142/91     Pulse Rate 87     Resp 16     Temp 98.7 F (37.1 C)     Temp Source Oral     SpO2 98 %     Weight  Height 5\' 9"  (1.753 m)     Head Circumference      Peak Flow      Pain Score 4   Constitutional: Alert and oriented.  Eyes: Conjunctivae are normal.  ENT      Head: Normocephalic and atraumatic.      Nose: No congestion/rhinnorhea.      Mouth/Throat: Mucous membranes are moist.      Neck: No stridor. Hematological/Lymphatic/Immunilogical: No cervical lymphadenopathy. Cardiovascular: Normal rate, regular rhythm.  No murmurs, rubs, or gallops.  Respiratory: Normal respiratory effort without tachypnea nor retractions. Breath sounds are clear and equal bilaterally. No wheezes/rales/rhonchi. Gastrointestinal: Soft and non tender. No rebound. No  guarding.  Genitourinary: Deferred Musculoskeletal: Normal range of motion in all extremities. No lower extremity edema. Neurologic:  Normal speech and language. No gross focal neurologic deficits are appreciated.  Skin:  Skin is warm, dry and intact. No rash noted. Psychiatric: Mood and affect are normal. Speech and behavior are normal. Patient exhibits appropriate insight and judgment.  ____________________________________________    LABS (pertinent positives/negatives)  Lipase 29 CBC wbc 7.8, hgb 15.3, plt 248 CMP na 132, k 3.7, glu 272, cr 0.99  ____________________________________________   EKG  None  ____________________________________________    RADIOLOGY  None   ____________________________________________   PROCEDURES  Procedures  ____________________________________________   INITIAL IMPRESSION / ASSESSMENT AND PLAN / ED COURSE  Pertinent labs & imaging results that were available during my care of the patient were reviewed by me and considered in my medical decision making (see chart for details).   Patient presents to the emergency department today because of concerns for nausea and vomiting.  Patient's blood work does show elevated glucose however no other signs concerning for DKA.  No leukocytosis.  This point somewhat unclear etiology however given lack of abdominal tenderness I do not feel any emergent CT imaging is necessary at this time.  Do wonder patient is suffering from potentially a stomach virus.  Will give patient IV fluids and antinausea medications.  Will then p.o. challenge.  Patient tolerates p.o. challenge do think it be reasonable to discharge with antiemetics and follow-up.  ____________________________________________   FINAL CLINICAL IMPRESSION(S) / ED DIAGNOSES  Final diagnoses:  Nausea and vomiting, intractability of vomiting not specified, unspecified vomiting type     Note: This dictation was prepared with Dragon  dictation. Any transcriptional errors that result from this process are unintentional     , MD 03/31/21 1931

## 2021-03-31 NOTE — Discharge Instructions (Addendum)
Please seek medical attention for any high fevers, chest pain, shortness of breath, change in behavior, persistent vomiting, bloody stool or any other new or concerning symptoms.  

## 2021-03-31 NOTE — ED Provider Notes (Signed)
-----------------------------------------   7:45 PM on 03/31/2021 -----------------------------------------  Blood pressure (!) 130/96, pulse 76, temperature 98.7 F (37.1 C), temperature source Oral, resp. rate 18, height 5\' 9"  (1.753 m), SpO2 97 %.  Assuming care from Dr. .  In short, Omar Dennis is a 45 y.o. male with a chief complaint of Emesis .  Refer to the original H&P for additional details.  The current plan of care is to reassess after IVF and PO challenge.  ----------------------------------------- 8:34 PM on 03/31/2021 ----------------------------------------- Patient feeling better following IV fluids, is now tolerating p.o. without difficulty.  He is appropriate for discharge home and will be prescribed Zofran.  He was counseled to return to the ED for new worsening symptoms, patient agrees with plan.    06/01/2021, MD 03/31/21 2034

## 2022-05-01 ENCOUNTER — Encounter: Payer: Self-pay | Admitting: Emergency Medicine

## 2022-05-01 ENCOUNTER — Emergency Department: Payer: 59

## 2022-05-01 DIAGNOSIS — R079 Chest pain, unspecified: Secondary | ICD-10-CM | POA: Insufficient documentation

## 2022-05-01 DIAGNOSIS — R42 Dizziness and giddiness: Secondary | ICD-10-CM | POA: Diagnosis not present

## 2022-05-01 DIAGNOSIS — K0889 Other specified disorders of teeth and supporting structures: Secondary | ICD-10-CM | POA: Insufficient documentation

## 2022-05-01 DIAGNOSIS — Z8673 Personal history of transient ischemic attack (TIA), and cerebral infarction without residual deficits: Secondary | ICD-10-CM | POA: Insufficient documentation

## 2022-05-01 DIAGNOSIS — I10 Essential (primary) hypertension: Secondary | ICD-10-CM | POA: Diagnosis not present

## 2022-05-01 DIAGNOSIS — R0602 Shortness of breath: Secondary | ICD-10-CM | POA: Insufficient documentation

## 2022-05-01 LAB — CBC
HCT: 46.6 % (ref 39.0–52.0)
Hemoglobin: 15.4 g/dL (ref 13.0–17.0)
MCH: 29.2 pg (ref 26.0–34.0)
MCHC: 33 g/dL (ref 30.0–36.0)
MCV: 88.4 fL (ref 80.0–100.0)
Platelets: 255 10*3/uL (ref 150–400)
RBC: 5.27 MIL/uL (ref 4.22–5.81)
RDW: 13.3 % (ref 11.5–15.5)
WBC: 6.3 10*3/uL (ref 4.0–10.5)
nRBC: 0 % (ref 0.0–0.2)

## 2022-05-01 LAB — BASIC METABOLIC PANEL
Anion gap: 7 (ref 5–15)
BUN: 8 mg/dL (ref 6–20)
CO2: 30 mmol/L (ref 22–32)
Calcium: 9.4 mg/dL (ref 8.9–10.3)
Chloride: 106 mmol/L (ref 98–111)
Creatinine, Ser: 1.07 mg/dL (ref 0.61–1.24)
GFR, Estimated: >60 mL/min (ref >60)
Glucose, Bld: 137 mg/dL — ABNORMAL HIGH (ref 70–99)
Potassium: 3.9 mmol/L (ref 3.5–5.1)
Sodium: 143 mmol/L (ref 135–145)

## 2022-05-01 LAB — TROPONIN I (HIGH SENSITIVITY): Troponin I (High Sensitivity): 14 ng/L (ref <18)

## 2022-05-01 NOTE — ED Triage Notes (Signed)
Pt presents via POV with complaints of CP and dental pain that started tonight. Pt states he was just D/C over the weekend for cardiac issues but developed left sided dental pain tonight which caused Chest discomfort.  Denies N/V, fevers, or SOB.

## 2022-05-02 ENCOUNTER — Emergency Department
Admission: EM | Admit: 2022-05-02 | Discharge: 2022-05-02 | Disposition: A | Discharge status: Home / Self Care | Payer: 59 | Attending: Emergency Medicine | Admitting: Emergency Medicine

## 2022-05-02 DIAGNOSIS — R079 Chest pain, unspecified: Secondary | ICD-10-CM

## 2022-05-02 DIAGNOSIS — K0889 Other specified disorders of teeth and supporting structures: Secondary | ICD-10-CM

## 2022-05-02 LAB — TROPONIN I (HIGH SENSITIVITY): Troponin I (High Sensitivity): 15 ng/L (ref <18)

## 2022-05-02 NOTE — ED Notes (Signed)
Pt DC to home. Dc instructions reivewed with all questions answered, pt verbalizes understanding. Ambulatory out of dept with steady gait

## 2022-05-02 NOTE — Discharge Instructions (Addendum)
If your chest pain recurs and is not improving please return to the emergency department immediately.

## 2022-05-02 NOTE — ED Provider Notes (Signed)
[facility] Provider Note    Event Date/Time   First MD Initiated Contact with Patient 05/02/22 [phone]     (approximate)   History   Chest Pain and Dental Pain   HPI  BIRCH FARINO is a 46 y.o. male with past medical history of hypertension hyperlipidemia cocaine use disorder vasospastic angina who presents with chest pain and dental pain.  Earlier this evening patient started having pain of the left posterior molar.  He put peroxide on it which helped initially.  The pain then started radiating down into his left chest describes it as pressure-like with associated shortness of breath and diaphoresis.  Lasted for minutes and then resolved.  He still has some very mild chest discomfort but not severe.  He denies abdominal pain fevers chills facial swelling.  Patient was just admitted to [facility] with chest pain and elevated troponins which were trended to peak and he was treated medically with outpatient follow-up with cardiology planned.  Patient admits to smoking marijuana but denies cocaine use recently.    Past Medical History:  Diagnosis Date   Hyperlipidemia    Hypertension    Stroke (cerebrum) [facility])     Patient Active Problem List   Diagnosis Date Noted   Left-sided weakness 10/17/2016     Physical Exam  Triage Vital Signs: ED Triage Vitals  Enc Vitals Group     BP 05/01/22 2255 (!) 172/115     Pulse Rate 05/01/22 2255 71     Resp 05/01/22 2255 18     Temp 05/01/22 2255 98.2 F (36.8 C)     Temp Source 05/01/22 2255 Oral     SpO2 05/01/22 2255 99 %     Weight 05/01/22 2253 175 lb (79.4 kg)     Height 05/01/22 2253 5\' 9"  (1.753 m)     Head Circumference --      Peak Flow --      Pain Score 05/01/22 2253 10     Pain Loc --      Pain Edu? --      Excl. in GC? --     Most recent vital signs: Vitals:   05/01/22 2256 05/02/22 0105  BP: (!) 172/115 (!) 187/101  Pulse: 83 (!) 59  Resp: 16 18  Temp: 98.2 F (36.8 C) 98.4 F (36.9 C)   SpO2: 99% 98%     General: Awake, no distress.  CV:  Good peripheral perfusion.  No edema 2+ DP pulses bilaterally Resp:  Normal effort.  Lungs are clear Abd:  No distention.  Abdomen is soft nontender Neuro:             Awake, Alert, Oriented x 3  Other:  No facial swelling, poor dentition, left most posterior molar is tender to percussion with dental carry partially broken, no fluctuance   ED Results / Procedures / Treatments  Labs (all labs ordered are listed, but only abnormal results are displayed) Labs Reviewed  BASIC METABOLIC PANEL - Abnormal; Notable for the following components:      Result Value   Glucose, Bld 137 (*)    All other components within normal limits  CBC  TROPONIN I (HIGH SENSITIVITY)  TROPONIN I (HIGH SENSITIVITY)     EKG  EKG interpretation performed by myself: NSR, nml axis, nml intervals, no acute ischemic changes    RADIOLOGY I reviewed and interpreted the CXR which does not show any acute cardiopulmonary process    PROCEDURES:  Critical  Care performed: No  Procedures  The patient is on the cardiac monitor to evaluate for evidence of arrhythmia and/or significant heart rate changes.   MEDICATIONS ORDERED IN ED: Medications - No data to display   IMPRESSION / MDM / ASSESSMENT AND PLAN / ED COURSE  I reviewed the triage vital signs and the nursing notes.                              Patient's presentation is most consistent with acute presentation with potential threat to life or bodily function.  Differential diagnosis includes, but is not limited to, acute coronary syndrome, angina, referred pain, musculoskeletal pain, dissection, PE  The patient is a 46 year old male with past medical history of angina recent admission with elevated troponins at [facility] presents with tooth pain and chest pain.  His pain started with left lower molar pain which then evolved to involve his chest on the left side.  Describes the chest pain  as pressure-like with associated shortness of breath and diaphoresis and lightheadedness lasted for several minutes.  His pain is largely resolved although he does have some ongoing very mild discomfort.  Still has ongoing tooth pain.  In terms of the tooth there is no significant swelling no fluctuance or palpable abscess he has a broken left posterior molar very poor dentition no drainable abscess.  Do not think antibiotics will benefit.  Recommended dental follow-up.  In terms of his chest pain his EKG is nonischemic and he has 2 negative troponins.  He plans to follow-up with cardiology and I think given he is pain-free that this is appropriate.       FINAL CLINICAL IMPRESSION(S) / ED DIAGNOSES   Final diagnoses:  Chest pain, unspecified type  Pain, dental     Rx / DC Orders   ED Discharge Orders     None        Note:  This document was prepared using Dragon voice recognition software and may include unintentional dictation errors.   Georga Hacking, MD 05/02/22 [phone]

## 2022-07-18 ENCOUNTER — Emergency Department
Admission: EM | Admit: 2022-07-18 | Discharge: 2022-07-18 | Disposition: A | Discharge status: Home / Self Care | Payer: Self-pay | Attending: Emergency Medicine | Admitting: Emergency Medicine

## 2022-07-18 ENCOUNTER — Encounter: Payer: Self-pay | Admitting: Medical Oncology

## 2022-07-18 DIAGNOSIS — S29012A Strain of muscle and tendon of back wall of thorax, initial encounter: Secondary | ICD-10-CM | POA: Insufficient documentation

## 2022-07-18 DIAGNOSIS — X500XXA Overexertion from strenuous movement or load, initial encounter: Secondary | ICD-10-CM | POA: Insufficient documentation

## 2022-07-18 DIAGNOSIS — I10 Essential (primary) hypertension: Secondary | ICD-10-CM | POA: Insufficient documentation

## 2022-07-18 HISTORY — DX: Type 2 diabetes mellitus without complications: E11.9

## 2022-07-18 MED ORDER — KETOROLAC TROMETHAMINE 10 MG PO TABS: 10.0000 mg | ORAL_TABLET | Freq: Four times a day (QID) | ORAL | 0 refills | Status: DC | PRN

## 2022-07-18 MED ORDER — KETOROLAC TROMETHAMINE 10 MG PO TABS
10.0000 mg | ORAL_TABLET | Freq: Once | ORAL | Status: AC
  Administered 2022-07-18: 10 mg via ORAL
  Filled 2022-07-18: qty 1

## 2022-07-18 NOTE — ED Provider Notes (Signed)
   [facility] Provider Note    Event Date/Time   First MD Initiated Contact with Patient 07/18/22 1832     (approximate)   History   Back Pain   HPI  Omar Dennis is a 46 y.o. male with a past history of hypertension who comes the ED complaining of acute right mid back pain that started about 1:00 PM today while lifting a shop vac up off the ground in a twisting motion.  Pain is worse with movement.  No alleviating factors.  No chest pain or shortness of breath, no falls or trauma.  No lower extremity paresthesia or motor weakness.     Physical Exam   Triage Vital Signs: ED Triage Vitals  Enc Vitals Group     BP 07/18/22 1820 (!) 152/110     Pulse Rate 07/18/22 1820 97     Resp 07/18/22 1820 19     Temp 07/18/22 1820 98.4 F (36.9 C)     Temp Source 07/18/22 1820 Oral     SpO2 07/18/22 1820 99 %     Weight 07/18/22 1821 174 lb 2.6 oz (79 kg)     Height 07/18/22 1821 5\' 9"  (1.753 m)     Head Circumference --      Peak Flow --      Pain Score 07/18/22 1821 10     Pain Loc --      Pain Edu? --      Excl. in [place]? --     Most recent vital signs: Vitals:   07/18/22 1820  BP: (!) 152/110  Pulse: 97  Resp: 19  Temp: 98.4 F (36.9 C)  SpO2: 99%    General: Awake, no distress.  CV:  Good peripheral perfusion.  Resp:  Normal effort.  Abd:  No distention.  Soft nontender Other:  There is muscular tenderness in the right mid back latissimus.  No midline spinal tenderness.  Straight leg raise negative bilaterally.   ED Results / Procedures / Treatments   Labs (all labs ordered are listed, but only abnormal results are displayed) Labs Reviewed - No data to display   RADIOLOGY    PROCEDURES:  Procedures   MEDICATIONS ORDERED IN ED: Medications  ketorolac (TORADOL) tablet 10 mg (has no administration in time range)     IMPRESSION / MDM / ASSESSMENT AND PLAN / ED COURSE  I reviewed the triage vital signs and the nursing notes.                               Differential diagnosis includes, but is not limited to, muscle strain/spasm.  Doubt fracture, cauda equina, nerve entrapment, infection  Counseled on light activity and stretching, NSAIDs for pain.       FINAL CLINICAL IMPRESSION(S) / ED DIAGNOSES   Final diagnoses:  Strain of latissimus dorsi muscle, initial encounter     Rx / DC Orders   ED Discharge Orders          Ordered    ketorolac (TORADOL) 10 MG tablet  Every 6 hours PRN        07/18/22 1848             Note:  This document was prepared using Dragon voice recognition software and may include unintentional dictation errors.   Carrie Mew, MD 07/18/22 [phone]

## 2022-07-18 NOTE — ED Triage Notes (Signed)
Pt reports he was lifting a very heavy shop vac today when he began having pain to rt mid back. Pt reports pain upon moving.

## 2022-10-24 ENCOUNTER — Encounter: Payer: Self-pay | Admitting: Family

## 2022-11-05 NOTE — Progress Notes (Signed)
  This encounter was created in error - please disregard. No show 

## 2022-11-10 ENCOUNTER — Emergency Department
Admission: EM | Admit: 2022-11-10 | Discharge: 2022-11-10 | Disposition: A | Discharge status: Home / Self Care | Payer: Self-pay | Attending: Emergency Medicine | Admitting: Emergency Medicine

## 2022-11-10 ENCOUNTER — Other Ambulatory Visit: Payer: Self-pay

## 2022-11-10 ENCOUNTER — Encounter: Payer: Self-pay | Admitting: Emergency Medicine

## 2022-11-10 ENCOUNTER — Emergency Department: Payer: Self-pay

## 2022-11-10 DIAGNOSIS — R739 Hyperglycemia, unspecified: Secondary | ICD-10-CM

## 2022-11-10 DIAGNOSIS — I10 Essential (primary) hypertension: Secondary | ICD-10-CM | POA: Insufficient documentation

## 2022-11-10 DIAGNOSIS — E1165 Type 2 diabetes mellitus with hyperglycemia: Secondary | ICD-10-CM | POA: Insufficient documentation

## 2022-11-10 DIAGNOSIS — R079 Chest pain, unspecified: Secondary | ICD-10-CM | POA: Insufficient documentation

## 2022-11-10 LAB — BASIC METABOLIC PANEL
Anion gap: 11 (ref 5–15)
BUN: 11 mg/dL (ref 6–20)
CO2: 26 mmol/L (ref 22–32)
Calcium: 8.6 mg/dL — ABNORMAL LOW (ref 8.9–10.3)
Chloride: 95 mmol/L — ABNORMAL LOW (ref 98–111)
Creatinine, Ser: 0.97 mg/dL (ref 0.61–1.24)
GFR, Estimated: >60 mL/min (ref >60)
Glucose, Bld: 454 mg/dL — ABNORMAL HIGH (ref 70–99)
Potassium: 3.9 mmol/L (ref 3.5–5.1)
Sodium: 132 mmol/L — ABNORMAL LOW (ref 135–145)

## 2022-11-10 LAB — URINE DRUG SCREEN, QUALITATIVE (ARMC ONLY)
Amphetamines, Ur Screen: POSITIVE — AB
Barbiturates, Ur Screen: NOT DETECTED
Benzodiazepine, Ur Scrn: NOT DETECTED
Cannabinoid 50 Ng, Ur ~~LOC~~: NOT DETECTED
Cocaine Metabolite,Ur ~~LOC~~: POSITIVE — AB
MDMA (Ecstasy)Ur Screen: NOT DETECTED
Methadone Scn, Ur: NOT DETECTED
Opiate, Ur Screen: NOT DETECTED
Phencyclidine (PCP) Ur S: NOT DETECTED
Tricyclic, Ur Screen: NOT DETECTED

## 2022-11-10 LAB — TROPONIN I (HIGH SENSITIVITY)
Troponin I (High Sensitivity): 10 ng/L (ref <18)
Troponin I (High Sensitivity): 16 ng/L (ref <18)

## 2022-11-10 LAB — URINALYSIS, ROUTINE W REFLEX MICROSCOPIC
Bacteria, UA: NONE SEEN
Bilirubin Urine: NEGATIVE
Glucose, UA: >=500 mg/dL — AB
Hgb urine dipstick: NEGATIVE
Ketones, ur: NEGATIVE mg/dL
Leukocytes,Ua: NEGATIVE
Nitrite: NEGATIVE
Protein, ur: NEGATIVE mg/dL
Specific Gravity, Urine: 1.03 (ref 1.005–1.030)
pH: 6 (ref 5.0–8.0)

## 2022-11-10 LAB — CBC
HCT: 43.7 % (ref 39.0–52.0)
Hemoglobin: 14.6 g/dL (ref 13.0–17.0)
MCH: 28.5 pg (ref 26.0–34.0)
MCHC: 33.4 g/dL (ref 30.0–36.0)
MCV: 85.2 fL (ref 80.0–100.0)
Platelets: 223 10*3/uL (ref 150–400)
RBC: 5.13 MIL/uL (ref 4.22–5.81)
RDW: 12.2 % (ref 11.5–15.5)
WBC: 6.4 10*3/uL (ref 4.0–10.5)
nRBC: 0 % (ref 0.0–0.2)

## 2022-11-10 LAB — CBG MONITORING, ED: Glucose-Capillary: 199 mg/dL — ABNORMAL HIGH (ref 70–99)

## 2022-11-10 MED ORDER — INSULIN ASPART 100 UNIT/ML IJ SOLN
8.0000 [IU] | Freq: Once | INTRAMUSCULAR | Status: AC
  Administered 2022-11-10: 8 [IU] via SUBCUTANEOUS
  Filled 2022-11-10: qty 1

## 2022-11-10 MED ORDER — LACTATED RINGERS IV BOLUS
1000.0000 mL | Freq: Once | INTRAVENOUS | Status: AC
  Administered 2022-11-10: 1000 mL via INTRAVENOUS

## 2022-11-10 MED ORDER — INSULIN ISOPHANE & REGULAR (HUMAN 70-30)100 UNIT/ML KWIKPEN: 15.0000 [IU] | PEN_INJECTOR | Freq: Two times a day (BID) | SUBCUTANEOUS | 11 refills | Status: AC

## 2022-11-10 NOTE — ED Triage Notes (Signed)
Pt in via EMS from a hotel where she lives. Per EMS, was called for weakness and lethargic for 1 week and CP for 2 days. CBG 365, 200/120, HR 90, 99% RA. Pt was givne 372m of asa.

## 2022-11-10 NOTE — ED Notes (Signed)
Patient transported to X-ray 

## 2022-11-10 NOTE — ED Triage Notes (Signed)
Pt to ED via [facility] per pt, he has been having chest pain for the last 2 days. Pt reports that the pain started when he was laying down. Pt falling asleep during triage. Pt states that he is also having pain in his lower abdomen on the right side and it is difficult for him to urinate at times. Pt also reports that he is tired a lot and that his glucose has been running high. Pt states that he takes PO and insulin medication, has not missed any doses.

## 2022-11-10 NOTE — ED Notes (Signed)
Pt given discharge instructions. Patient voiced understanding of instructions. Patient unable to sign, due to pen pad not working.

## 2022-11-10 NOTE — ED Provider Notes (Addendum)
[facility] Provider Note    Event Date/Time   First MD Initiated Contact with Patient 11/10/22 1456     (approximate)   History   Chest Pain   HPI  Omar Dennis is a 47 y.o. male  with pmh DM, HTN, HLD who p/w chest pain.  Patient tells me he is having chest pain since yesterday.  Has been coming and going.  Lasted for several minutes yesterday and resolved.  Woke up with pain this morning but it is significantly resolved now.  It is nonpleuritic nonexertional.  Described as a grabbing sensation in the left side of his chest that does not radiate.  Denies associated nausea diaphoresis or shortness of breath.  He denies history of similar.  Also tells me he has been very fatigued for several weeks and thinks it is because his blood sugars been running high.  Says he was initially on metformin and 70/30 insulin 15 units twice daily but was diet controlled at 1 point but blood sugars been running high for several weeks and he is now back on the insulin but it is expired.  Tells me he does watch his diet.  Denies drug or alcohol use.  Patient is also endorsing frequent urination and difficulty urinating with sensation of incomplete emptying.  Denies pain with urination. Denies fevers chills cough congestion abdominal pain vomiting.     Past Medical History:  Diagnosis Date   Diabetes mellitus without complication ([place])    Hyperlipidemia    Hypertension    Stroke (cerebrum) [facility])     Patient Active Problem List   Diagnosis Date Noted   Left-sided weakness 10/17/2016     Physical Exam  Triage Vital Signs: ED Triage Vitals  Enc Vitals Group     BP 11/10/22 1421 (!) 157/105     Pulse Rate 11/10/22 1421 81     Resp 11/10/22 1421 16     Temp 11/10/22 1421 98.5 F (36.9 C)     Temp Source 11/10/22 1421 Oral     SpO2 11/10/22 1421 97 %     Weight 11/10/22 1422 170 lb (77.1 kg)     Height 11/10/22 1422 5' 9"$  (1.753 m)     Head Circumference --       Peak Flow --      Pain Score 11/10/22 1422 7     Pain Loc --      Pain Edu? --      Excl. in [place]? --     Most recent vital signs: Vitals:   11/10/22 1421 11/10/22 1730  BP: (!) 157/105 (!) 162/105  Pulse: 81 88  Resp: 16 16  Temp: 98.5 F (36.9 C)   SpO2: 97% 97%     General: Awake, no distress.  CV:  Good peripheral perfusion. No edema Resp:  Normal effort. Lungs are clear Abd:  No distention. Soft, nontender Neuro:             Awake, Alert, Oriented x 3  Other:     ED Results / Procedures / Treatments  Labs (all labs ordered are listed, but only abnormal results are displayed) Labs Reviewed  BASIC METABOLIC PANEL - Abnormal; Notable for the following components:      Result Value   Sodium 132 (*)    Chloride 95 (*)    Glucose, Bld 454 (*)    Calcium 8.6 (*)    All other components within normal limits  URINALYSIS, ROUTINE W  REFLEX MICROSCOPIC - Abnormal; Notable for the following components:   Color, Urine STRAW (*)    APPearance CLEAR (*)    Glucose, UA >=500 (*)    All other components within normal limits  URINE DRUG SCREEN, QUALITATIVE ([facility] ONLY) - Abnormal; Notable for the following components:   Amphetamines, Ur Screen POSITIVE (*)    Cocaine Metabolite,Ur [place] POSITIVE (*)    All other components within normal limits  CBG MONITORING, ED - Abnormal; Notable for the following components:   Glucose-Capillary 199 (*)    All other components within normal limits  URINE CULTURE  CBC  TROPONIN I (HIGH SENSITIVITY)  TROPONIN I (HIGH SENSITIVITY)     EKG  EKG interpretation performed by myself: NSR, nml axis, nml intervals, no acute ischemic changes    RADIOLOGY I reviewed and interpreted the CXR which does not show any acute cardiopulmonary process    PROCEDURES:  Critical Care performed: No  Procedures  The patient is on the cardiac monitor to evaluate for evidence of arrhythmia and/or significant heart rate changes.   MEDICATIONS ORDERED  IN ED: Medications  lactated ringers bolus 1,000 mL (0 mLs Intravenous Stopped 11/10/22 1727)  insulin aspart (novoLOG) injection 8 Units (8 Units Subcutaneous Given 11/10/22 1547)     IMPRESSION / MDM / ASSESSMENT AND PLAN / ED COURSE  I reviewed the triage vital signs and the nursing notes.                              Patient's presentation is most consistent with acute presentation with potential threat to life or bodily function.  Differential diagnosis includes, but is not limited to, ACS, musculoskeletal pain, reflux, less likely dissection, pulmonary embolism  The patient is a 47 year old male who presents with 2 days of intermittent chest pain as well as fatigue elevated blood sugar at home.  Does have diabetes was previously on metformin and 70/30 insulin.  Apparently and was initially diet controlled just on metformin but has had to go back on the insulin over the last several weeks because of elevated blood sugars at home.  During this time he feels quite fatigued.  Is having polyuria.  Over the last 2 days is also had chest pain which she describes as a grabbing sensation the left side of his chest does not radiate nonexertional nonpleuritic.  The episode today started this morning lasted till essentially when he got here and is relatively pain-free at this time.  Patient denies any history of known cardiac disease.  Does that he has had chest pain in the past.  Vitals notable for hypertension otherwise reassuring.  Patient overall looks well abdomen soft nontender lungs are clear no signs of DVT.  Reviewed his EKG there are no obvious ischemic changes.  Will check troponins CBC BMP.  Will also order urinalysis and bladder scan.  Primary concern is for ACS specially with his uncontrolled diabetes given pain has resolved I am less concerned for dissection or pulmonary embolism.  Patient's labs are notable for elevated blood sugar 450 but no signs of DKA including no anion gap normal  bicarb.  First troponin is negative.  Will repeat troponin.  Will give a bolus of fluid and subcu insulin.  Patient's repeat troponin is negative at 16.  First was 10 but do not feel delta of 6 is significant.  Patient is not having any active chest pain.  Patient seemed to be somewhat  somnolent intermittently in the ED so I did order a UDS which is positive for amphetamines and cocaine.   Initially was having some difficulty urinating in the ED with bladder scan showing about 400 cc, when standing up he was able to urinate about 400 cc.  Urinalysis has 6-10 white cells.  Patient is having more difficulty urinating but is not having frank dysuria.  Low suspicion for UTI at this time.  Will add on a culture in case patient becomes more symptomatic but will not treat at this time.  Given patient with negative troponin and reassuring workup and no active chest pain think that he is appropriate to be discharged.  Repeat blood sugar is 199. I will refill his insulin and provide list of primary doctors he can follow-up with.  I stressed the importance of PCP follow-up for management of his diabetes.  Clinical Course as of 11/10/22 1815  Sat Nov 10, 2022  1753 Amphetamines, Ur Screen(!): POSITIVE [KM]  1753 Cocaine Metabolite,Ur [place](!): POSITIVE [KM]    Clinical Course User Index [KM] Rada Hay, MD     FINAL CLINICAL IMPRESSION(S) / ED DIAGNOSES   Final diagnoses:  Hyperglycemia  Chest pain, unspecified type     Rx / DC Orders   ED Discharge Orders          Ordered    insulin isophane & regular human KwikPen (HUMULIN 70/30 MIX) (70-30) 100 UNIT/ML KwikPen  2 times daily        11/10/22 1807             Note:  This document was prepared using Dragon voice recognition software and may include unintentional dictation errors.   Rada Hay, MD 11/10/22 1756    Rada Hay, MD 11/10/22 Evalee Jefferson    Rada Hay, MD 11/10/22 [phone]

## 2022-11-10 NOTE — Discharge Instructions (Addendum)
It is very important that you follow-up with the primary care doctor regarding your elevated blood sugar.  I have refilled your insulin.  Please call one of the providers below to schedule an appointment.   Please go to the following website to schedule new (and existing) patient appointments:   [email or web address]   The following is a list of primary care offices in the area who are accepting new patients at this time.  Please reach out to one of them directly and let them know you would like to schedule an appointment to follow up on an Emergency Department visit, and/or to establish a new primary care provider (PCP).  There are likely other primary care clinics in the are who are accepting new patients, but this is an excellent place to start:  [place] physician: Dr Lavon Paganini [address] [place], [place] [address] [phone]  [facility] Lead Physician: Dr Steele Sizer [address], [place], [place] [address] [phone]  [place] Physician: Dr Park Liter [address] [place], [place] [address] [phone]  [facility] Lead Physician: Dr Dewaine Oats [address], [place], [place] [address] [phone]  [place] [facility] [place] Physician: Dr Halina Maidens [address] [place], [place], [place] [address] [phone]

## 2022-11-12 ENCOUNTER — Encounter: Payer: 59 | Admitting: Adult Health

## 2022-11-12 ENCOUNTER — Encounter: Payer: Self-pay | Admitting: Adult Health

## 2022-11-12 LAB — URINE CULTURE: Culture: NO GROWTH

## 2022-11-14 NOTE — Progress Notes (Signed)
This encounter was created in error - please disregard.

## 2023-01-02 ENCOUNTER — Other Ambulatory Visit: Payer: Self-pay

## 2023-01-02 ENCOUNTER — Encounter: Payer: Self-pay | Admitting: *Deleted

## 2023-01-02 ENCOUNTER — Emergency Department: Payer: 59

## 2023-01-02 DIAGNOSIS — R300 Dysuria: Secondary | ICD-10-CM | POA: Diagnosis present

## 2023-01-02 DIAGNOSIS — A749 Chlamydial infection, unspecified: Secondary | ICD-10-CM | POA: Diagnosis not present

## 2023-01-02 DIAGNOSIS — Z794 Long term (current) use of insulin: Secondary | ICD-10-CM | POA: Insufficient documentation

## 2023-01-02 DIAGNOSIS — Z79899 Other long term (current) drug therapy: Secondary | ICD-10-CM | POA: Diagnosis not present

## 2023-01-02 DIAGNOSIS — E1165 Type 2 diabetes mellitus with hyperglycemia: Secondary | ICD-10-CM | POA: Diagnosis not present

## 2023-01-02 DIAGNOSIS — I10 Essential (primary) hypertension: Secondary | ICD-10-CM | POA: Diagnosis not present

## 2023-01-02 DIAGNOSIS — N433 Hydrocele, unspecified: Secondary | ICD-10-CM | POA: Insufficient documentation

## 2023-01-02 DIAGNOSIS — Z8673 Personal history of transient ischemic attack (TIA), and cerebral infarction without residual deficits: Secondary | ICD-10-CM | POA: Insufficient documentation

## 2023-01-02 DIAGNOSIS — D72829 Elevated white blood cell count, unspecified: Secondary | ICD-10-CM | POA: Insufficient documentation

## 2023-01-02 DIAGNOSIS — Z7984 Long term (current) use of oral hypoglycemic drugs: Secondary | ICD-10-CM | POA: Insufficient documentation

## 2023-01-02 LAB — COMPREHENSIVE METABOLIC PANEL
ALT: 34 U/L (ref 0–44)
AST: 19 U/L (ref 15–41)
Albumin: 4 g/dL (ref 3.5–5.0)
Alkaline Phosphatase: 98 U/L (ref 38–126)
Anion gap: 11 (ref 5–15)
BUN: 13 mg/dL (ref 6–20)
CO2: 28 mmol/L (ref 22–32)
Calcium: 9.9 mg/dL (ref 8.9–10.3)
Chloride: 94 mmol/L — ABNORMAL LOW (ref 98–111)
Creatinine, Ser: 1.02 mg/dL (ref 0.61–1.24)
GFR, Estimated: >60 mL/min (ref >60)
Glucose, Bld: 507 mg/dL (ref 70–99)
Potassium: 4.5 mmol/L (ref 3.5–5.1)
Sodium: 133 mmol/L — ABNORMAL LOW (ref 135–145)
Total Bilirubin: 1 mg/dL (ref 0.3–1.2)
Total Protein: 8.1 g/dL (ref 6.5–8.1)

## 2023-01-02 LAB — URINALYSIS, ROUTINE W REFLEX MICROSCOPIC
Bacteria, UA: NONE SEEN
Bilirubin Urine: NEGATIVE
Glucose, UA: >=500 mg/dL — AB
Ketones, ur: NEGATIVE mg/dL
Nitrite: NEGATIVE
Protein, ur: NEGATIVE mg/dL
Specific Gravity, Urine: 1.031 — ABNORMAL HIGH (ref 1.005–1.030)
pH: 5 (ref 5.0–8.0)

## 2023-01-02 LAB — CBC WITH DIFFERENTIAL/PLATELET
Abs Immature Granulocytes: 0.02 10*3/uL (ref 0.00–0.07)
Basophils Absolute: 0 10*3/uL (ref 0.0–0.1)
Basophils Relative: 1 %
Eosinophils Absolute: 0.1 10*3/uL (ref 0.0–0.5)
Eosinophils Relative: 1 %
HCT: 43.7 % (ref 39.0–52.0)
Hemoglobin: 14.9 g/dL (ref 13.0–17.0)
Immature Granulocytes: 0 %
Lymphocytes Relative: 40 %
Lymphs Abs: 2.5 10*3/uL (ref 0.7–4.0)
MCH: 28.1 pg (ref 26.0–34.0)
MCHC: 34.1 g/dL (ref 30.0–36.0)
MCV: 82.5 fL (ref 80.0–100.0)
Monocytes Absolute: 0.4 10*3/uL (ref 0.1–1.0)
Monocytes Relative: 7 %
Neutro Abs: 3.2 10*3/uL (ref 1.7–7.7)
Neutrophils Relative %: 51 %
Platelets: 250 10*3/uL (ref 150–400)
RBC: 5.3 MIL/uL (ref 4.22–5.81)
RDW: 11.9 % (ref 11.5–15.5)
WBC: 6.3 10*3/uL (ref 4.0–10.5)
nRBC: 0 % (ref 0.0–0.2)

## 2023-01-02 NOTE — ED Triage Notes (Addendum)
Pt reports that both testicles are painful   no swelling  pt states tender to touch.  Pt states pain radiates into right groin and back.  Pt reports diff urinating.  Pt denies penile discharge.    No n/v/d  pt alert  speech clear.

## 2023-01-03 ENCOUNTER — Emergency Department
Admission: EM | Admit: 2023-01-03 | Discharge: 2023-01-03 | Disposition: A | Discharge status: Home / Self Care | Payer: 59 | Attending: Emergency Medicine | Admitting: Emergency Medicine

## 2023-01-03 DIAGNOSIS — A64 Unspecified sexually transmitted disease: Secondary | ICD-10-CM

## 2023-01-03 DIAGNOSIS — A749 Chlamydial infection, unspecified: Secondary | ICD-10-CM

## 2023-01-03 DIAGNOSIS — N433 Hydrocele, unspecified: Secondary | ICD-10-CM

## 2023-01-03 DIAGNOSIS — R739 Hyperglycemia, unspecified: Secondary | ICD-10-CM

## 2023-01-03 LAB — CHLAMYDIA/NGC RT PCR (ARMC ONLY)
Chlamydia Tr: DETECTED — AB
N gonorrhoeae: NOT DETECTED

## 2023-01-03 LAB — CBG MONITORING, ED
Glucose-Capillary: 325 mg/dL — ABNORMAL HIGH (ref 70–99)
Glucose-Capillary: 307 mg/dL — ABNORMAL HIGH (ref 70–99)

## 2023-01-03 MED ORDER — SODIUM CHLORIDE 0.9 % IV BOLUS
1000.0000 mL | Freq: Once | INTRAVENOUS | Status: AC
  Administered 2023-01-03: 1000 mL via INTRAVENOUS

## 2023-01-03 MED ORDER — AZITHROMYCIN 1 G PO PACK
1.0000 g | PACK | Freq: Once | ORAL | Status: AC
  Administered 2023-01-03: 1 g via ORAL
  Filled 2023-01-03: qty 1

## 2023-01-03 MED ORDER — METFORMIN HCL 1000 MG PO TABS: 1000.0000 mg | ORAL_TABLET | Freq: Two times a day (BID) | ORAL | 2 refills | Status: AC

## 2023-01-03 MED ORDER — INSULIN ASPART 100 UNIT/ML IJ SOLN: 10.0000 [IU] | Freq: Once | INTRAMUSCULAR | Status: DC

## 2023-01-03 NOTE — Discharge Instructions (Addendum)
Please notify your sexual partner to seek treatment at the health department.  Restart Metformin twice daily for blood sugar control.  Return to the ER for worsening symptoms, persistent vomiting, difficulty breathing or other concerns.  Please go to the following website to schedule new (and existing) patient appointments:   [email or web address]   The following is a list of primary care offices in the area who are accepting new patients at this time.  Please reach out to one of them directly and let them know you would like to schedule an appointment to follow up on an Emergency Department visit, and/or to establish a new primary care provider (PCP).  There are likely other primary care clinics in the are who are accepting new patients, but this is an excellent place to start:  [facility] Lead physician: Dr Shirlee Latch [address] [place], [place] [address] [phone]  [facility] Lead Physician: Dr Alba Cory [address], [place], [place] [address] [phone]  [facility]  Lead Physician: Dr Olevia Perches [address] [place], [place] [address] [phone]  [facility] Lead Physician: Dr Sofie Hartigan [address] [place], [place] [address] [phone]  [facility] Lead Physician: Dr Bari Edward [address] [place], [place], [place] [address] [phone]

## 2023-01-03 NOTE — ED Provider Notes (Signed)
[facility] Provider Note    Event Date/Time   First MD Initiated Contact with Patient 01/03/23 0107     (approximate)   History   Testicle Pain   HPI  Omar Dennis is a 47 y.o. male who presents to the ED from home with a 3-day history of bilateral testicular pain radiating to his right groin and back associated with dysuria.  Denies fever/chills, chest pain, shortness of breath, abdominal pain, nausea, vomiting, urethral discharge.  States he ran out of metformin over 1 month ago.  Thirsty.     Past Medical History   Past Medical History:  Diagnosis Date   Diabetes mellitus without complication    Hyperlipidemia    Hypertension    Stroke (cerebrum)      Active Problem List   Patient Active Problem List   Diagnosis Date Noted   Left-sided weakness 10/17/2016     Past Surgical History  History reviewed. No pertinent surgical history.   Home Medications   Prior to Admission medications   Medication Sig Start Date End Date Taking? Authorizing Provider  metFORMIN (GLUCOPHAGE) 1000 MG tablet Take 1 tablet (1,000 mg total) by mouth 2 (two) times daily with a meal. 01/03/23  Yes Irean HongSung, Linford Quintela J, MD  GINSENG-GINKGO-B CMPLX-C-E-CA PO Take 2 tablets by mouth daily.    [provider]  hydrOXYzine (ATARAX/VISTARIL) 25 MG tablet Take 1 tablet (25 mg total) by mouth 3 (three) times daily as needed. 06/12/18   Fisher, Roselyn BeringSusan W, PA-C  insulin isophane & regular human KwikPen (HUMULIN 70/30 MIX) (70-30) 100 UNIT/ML KwikPen Inject 15 Units into the skin 2 (two) times daily. 11/10/22   Georga HackingMcHugh, Kelly Rose, MD  ketorolac (TORADOL) 10 MG tablet Take 1 tablet (10 mg total) by mouth every 6 (six) hours as needed for moderate pain. 07/18/22   Sharman CheekStafford, Phillip, MD  lisinopril (PRINIVIL,ZESTRIL) 20 MG tablet Take 1 tablet (20 mg total) by mouth daily. 06/12/18   Fisher, Roselyn BeringSusan W, PA-C  Loratadine 10 MG CAPS Take 1 capsule (10 mg total) by mouth daily. 06/12/18    Fisher, Roselyn BeringSusan W, PA-C  metoCLOPramide (REGLAN) 10 MG tablet Take 1 tablet (10 mg total) by mouth every 6 (six) hours as needed for nausea or vomiting. 03/31/21 03/31/22  Phineas SemenGoodman, Graydon, MD  Omega-3 Fatty Acids (FISH OIL PO) Take 2 capsules by mouth daily.    [provider]  ondansetron (ZOFRAN ODT) 4 MG disintegrating tablet Take 1 tablet (4 mg total) by mouth every 8 (eight) hours as needed for nausea or vomiting. 03/31/21   Chesley NoonJessup, Charles, MD     Allergies  Codeine   Family History   Family History  Problem Relation Age of Onset   Heart attack Father      Physical Exam  Triage Vital Signs: ED Triage Vitals [01/02/23 2250]  Enc Vitals Group     BP (!) 151/109     Pulse Rate (!) 105     Resp 20     Temp 98 F (36.7 C)     Temp Source Oral     SpO2 97 %     Weight 177 lb (80.3 kg)     Height 5\' 9"  (1.753 m)     Head Circumference      Peak Flow      Pain Score 8     Pain Loc      Pain Edu?      Excl. in [place]?  Updated Vital Signs: BP 126/86   Pulse 86   Temp 98.1 F (36.7 C) (Oral)   Resp 16   Ht 5\' 9"  (1.753 m)   Wt 80.3 kg   SpO2 99%   BMI 26.14 kg/m    General: Awake, no distress.  CV:  RRR.  Good peripheral perfusion.  Resp:  Normal effort.  CTAB. Abd:  Nontender to light or deep palpation.  No truncal vesicles.  No distention.  Other:  GU: Circumcised male.  No urethral discharge.  Right testicle mildly tender to palpation.  Not swollen.  No rash/ulcer/vesicles.  Strong bilateral cremasteric reflexes.   ED Results / Procedures / Treatments  Labs (all labs ordered are listed, but only abnormal results are displayed) Labs Reviewed  CHLAMYDIA/NGC RT PCR ([facility] ONLY)           - Abnormal; Notable for the following components:      Result Value   Chlamydia Tr DETECTED (*)    All other components within normal limits  COMPREHENSIVE METABOLIC PANEL - Abnormal; Notable for the following components:   Sodium 133 (*)    Chloride 94 (*)     Glucose, Bld 507 (*)    All other components within normal limits  URINALYSIS, ROUTINE W REFLEX MICROSCOPIC - Abnormal; Notable for the following components:   Color, Urine STRAW (*)    APPearance CLEAR (*)    Specific Gravity, Urine 1.031 (*)    Glucose, UA >=500 (*)    Hgb urine dipstick SMALL (*)    Leukocytes,Ua TRACE (*)    All other components within normal limits  CBG MONITORING, ED - Abnormal; Notable for the following components:   Glucose-Capillary 325 (*)    All other components within normal limits  CBG MONITORING, ED - Abnormal; Notable for the following components:   Glucose-Capillary 307 (*)    All other components within normal limits  CBC WITH DIFFERENTIAL/PLATELET     EKG  None   RADIOLOGY I have independently visualized and interpreted patient's ultrasound as well as noted the radiology interpretation:  Ultrasound: Small bilateral hydroceles and testicular microcalcifications  Official radiology report(s): US SCROTUM W/DOPPLER  Result Date: 01/03/2023 CLINICAL DATA:  Bilateral testicular pain right greater than left. EXAM: SCROTAL ULTRASOUND DOPPLER ULTRASOUND OF THE TESTICLES TECHNIQUE: Complete ultrasound examination of the testicles, epididymis, and other scrotal structures was performed. Color and spectral Doppler ultrasound were also utilized to evaluate blood flow to the testicles. COMPARISON:  None Available. FINDINGS: Right testicle Measurements: 3.9 cm x 1.6 cm x 2.7 cm. Several small testicular microcalcifications are seen. No mass is visualized. Left testicle Measurements: 2.9 cm x 1.6 cm x 2.6 cm. Several small testicular microcalcifications are seen. No mass is visualized. Right epididymis:  Normal in size and appearance. Left epididymis:  Normal in size and appearance. Hydrocele:  Small bilateral hydroceles are seen. Varicocele:  None visualized. Pulsed Doppler interrogation of both testes demonstrates normal low resistance arterial and venous  waveforms bilaterally. IMPRESSION: 1. Small bilateral hydroceles. 2. Small bilateral testicular microcalcifications. Electronically Signed   By: Aram Candela M.D.   On: 01/03/2023 00:36     PROCEDURES:  Critical Care performed: No  Procedures   MEDICATIONS ORDERED IN ED: Medications  sodium chloride 0.9 % bolus 1,000 mL (0 mLs Intravenous Stopped 01/03/23 0313)  azithromycin (ZITHROMAX) powder 1 g (1 g Oral Given 01/03/23 0147)     IMPRESSION / MDM / ASSESSMENT AND PLAN / ED COURSE  I reviewed the triage  vital signs and the nursing notes.                             47 year old male presenting with bilateral testicular pain.  Differential diagnosis includes but is not limited to torsion, epididymitis, STD, etc.  I have personally reviewed patient's records and note an ED visit for hyperglycemia on 11/10/2022.  Patient's presentation is most consistent with acute complicated illness / injury requiring diagnostic workup.  Laboratory results demonstrate normal WBC 6.3, hyperglycemia without elevation of anion gap.  Patient is positive for chlamydia; trace leukocytes with 11-20 WBC in urine.  Will administer azithromycin, initiate IV fluid resuscitation, subcu insulin and reassess.  Clinical Course as of 01/03/23 0550  Thu Jan 03, 2023  0138 Recheck blood sugar 325; will discontinue insulin and just run IV fluids. [JS]  0315 Repeat blood sugars are trending down.  Strict return precautions given.  Patient verbalizes understanding and agrees with plan of care. [JS]    Clinical Course User Index [JS] Irean Hong, MD     FINAL CLINICAL IMPRESSION(S) / ED DIAGNOSES   Final diagnoses:  Chlamydia  Hyperglycemia  STD (male)  Hydrocele in adult     Rx / DC Orders   ED Discharge Orders          Ordered    metFORMIN (GLUCOPHAGE) 1000 MG tablet  2 times daily with meals        01/03/23 0123             Note:  This document was prepared using Dragon voice recognition  software and may include unintentional dictation errors.   Irean Hong, MD 01/03/23 [phone]

## 2023-01-07 ENCOUNTER — Emergency Department
Admission: EM | Admit: 2023-01-07 | Discharge: 2023-01-07 | Disposition: A | Discharge status: Home / Self Care | Payer: 59 | Attending: Emergency Medicine | Admitting: Emergency Medicine

## 2023-01-07 ENCOUNTER — Other Ambulatory Visit: Payer: Self-pay

## 2023-01-07 ENCOUNTER — Encounter: Payer: Self-pay | Admitting: *Deleted

## 2023-01-07 DIAGNOSIS — Z8673 Personal history of transient ischemic attack (TIA), and cerebral infarction without residual deficits: Secondary | ICD-10-CM | POA: Insufficient documentation

## 2023-01-07 DIAGNOSIS — Z794 Long term (current) use of insulin: Secondary | ICD-10-CM | POA: Insufficient documentation

## 2023-01-07 DIAGNOSIS — I10 Essential (primary) hypertension: Secondary | ICD-10-CM | POA: Diagnosis not present

## 2023-01-07 DIAGNOSIS — R739 Hyperglycemia, unspecified: Secondary | ICD-10-CM

## 2023-01-07 DIAGNOSIS — E1165 Type 2 diabetes mellitus with hyperglycemia: Secondary | ICD-10-CM | POA: Insufficient documentation

## 2023-01-07 DIAGNOSIS — Z7984 Long term (current) use of oral hypoglycemic drugs: Secondary | ICD-10-CM | POA: Diagnosis not present

## 2023-01-07 LAB — URINALYSIS, ROUTINE W REFLEX MICROSCOPIC
Bacteria, UA: NONE SEEN
Bilirubin Urine: NEGATIVE
Glucose, UA: >=500 mg/dL — AB
Hgb urine dipstick: NEGATIVE
Ketones, ur: NEGATIVE mg/dL
Leukocytes,Ua: NEGATIVE
Nitrite: NEGATIVE
Protein, ur: NEGATIVE mg/dL
Specific Gravity, Urine: 1.031 — ABNORMAL HIGH (ref 1.005–1.030)
pH: 5 (ref 5.0–8.0)

## 2023-01-07 LAB — BASIC METABOLIC PANEL
Anion gap: 9 (ref 5–15)
BUN: 15 mg/dL (ref 6–20)
CO2: 29 mmol/L (ref 22–32)
Calcium: 9.2 mg/dL (ref 8.9–10.3)
Chloride: 93 mmol/L — ABNORMAL LOW (ref 98–111)
Creatinine, Ser: 1.07 mg/dL (ref 0.61–1.24)
GFR, Estimated: >60 mL/min (ref >60)
Glucose, Bld: 452 mg/dL — ABNORMAL HIGH (ref 70–99)
Potassium: 4.4 mmol/L (ref 3.5–5.1)
Sodium: 131 mmol/L — ABNORMAL LOW (ref 135–145)

## 2023-01-07 LAB — CBC
HCT: 46.6 % (ref 39.0–52.0)
Hemoglobin: 15.6 g/dL (ref 13.0–17.0)
MCH: 28.1 pg (ref 26.0–34.0)
MCHC: 33.5 g/dL (ref 30.0–36.0)
MCV: 84 fL (ref 80.0–100.0)
Platelets: 289 10*3/uL (ref 150–400)
RBC: 5.55 MIL/uL (ref 4.22–5.81)
RDW: 12.1 % (ref 11.5–15.5)
WBC: 8 10*3/uL (ref 4.0–10.5)
nRBC: 0 % (ref 0.0–0.2)

## 2023-01-07 LAB — CBG MONITORING, ED
Glucose-Capillary: 442 mg/dL — ABNORMAL HIGH (ref 70–99)
Glucose-Capillary: 188 mg/dL — ABNORMAL HIGH (ref 70–99)

## 2023-01-07 MED ORDER — INSULIN ASPART 100 UNIT/ML IJ SOLN
8.0000 [IU] | Freq: Once | INTRAMUSCULAR | Status: AC
  Administered 2023-01-07: 8 [IU] via INTRAVENOUS
  Filled 2023-01-07: qty 1

## 2023-01-07 MED ORDER — SODIUM CHLORIDE 0.9 % IV BOLUS
1000.0000 mL | Freq: Once | INTRAVENOUS | Status: AC
  Administered 2023-01-07: 1000 mL via INTRAVENOUS

## 2023-01-07 NOTE — ED Notes (Signed)
Fsbs 442 in triage

## 2023-01-07 NOTE — ED Notes (Signed)
Assumed care of pt, he ambulated to the bed.  C/O weakenss and feeling "like shit."  Pt states he just got his metforman prescription on Friday but can't get his sugar down.  Pt c/o thirst and a "weird taste on my tong."  Pt educated on the symptoms of high blood sugar.    Also c/o back pain on "one spot on my left (lower) back that feels like it pops."

## 2023-01-07 NOTE — ED Provider Notes (Signed)
[facility] Provider Note    Event Date/Time   First MD Initiated Contact with Patient 01/07/23 2104     (approximate)   History   Chief Complaint Hyperglycemia   HPI  Omar Dennis is a 47 y.o. male with past medical history of hypertension, hyperlipidemia, diabetes, and stroke who presents to the ED complaining of hyperglycemia.  Patient reports that he has been feeling weak and malaised for about the past week with increased thirst and urination.  He states his lips feel very dry and he feels very fatigued.  He has noticed that his blood sugar has been running high, states he has been compliant with his usual metformin dosing.  He denies any associated fevers, cough, nausea, vomiting, diarrhea, or dysuria.  He states that he was prescribed insulin many years ago but his doctor told him he did not need it anymore.  He does not currently have a PCP to follow-up with.      Physical Exam   Triage Vital Signs: ED Triage Vitals [01/07/23 2024]  Enc Vitals Group     BP (!) 143/108     Pulse Rate (!) 105     Resp 18     Temp 98.3 F (36.8 C)     Temp Source Oral     SpO2 98 %     Weight 170 lb (77.1 kg)     Height 5\' 9"  (1.753 m)     Head Circumference      Peak Flow      Pain Score 7     Pain Loc      Pain Edu?      Excl. in GC?     Most recent vital signs: Vitals:   01/07/23 2024 01/07/23 2150  BP: (!) 143/108 (!) 136/96  Pulse: (!) 105 88  Resp: 18 18  Temp: 98.3 F (36.8 C)   SpO2: 98% 100%    Constitutional: Alert and oriented. Eyes: Conjunctivae are normal. Head: Atraumatic. Nose: No congestion/rhinnorhea. Mouth/Throat: Mucous membranes are dry. Cardiovascular: Normal rate, regular rhythm. Grossly normal heart sounds.  2+ radial pulses bilaterally. Respiratory: Normal respiratory effort.  No retractions. Lungs CTAB. Gastrointestinal: Soft and nontender. No distention. Musculoskeletal: No lower extremity tenderness nor edema.   Neurologic:  Normal speech and language. No gross focal neurologic deficits are appreciated.    ED Results / Procedures / Treatments   Labs (all labs ordered are listed, but only abnormal results are displayed) Labs Reviewed  BASIC METABOLIC PANEL - Abnormal; Notable for the following components:      Result Value   Sodium 131 (*)    Chloride 93 (*)    Glucose, Bld 452 (*)    All other components within normal limits  URINALYSIS, ROUTINE W REFLEX MICROSCOPIC - Abnormal; Notable for the following components:   Color, Urine STRAW (*)    APPearance CLEAR (*)    Specific Gravity, Urine 1.031 (*)    Glucose, UA >=500 (*)    All other components within normal limits  CBG MONITORING, ED - Abnormal; Notable for the following components:   Glucose-Capillary 442 (*)    All other components within normal limits  CBG MONITORING, ED - Abnormal; Notable for the following components:   Glucose-Capillary 188 (*)    All other components within normal limits  CBC    PROCEDURES:  Critical Care performed: No  Procedures   MEDICATIONS ORDERED IN ED: Medications  sodium chloride 0.9 % bolus 1,000  mL (1,000 mLs Intravenous New Bag/Given 01/07/23 2150)  insulin aspart (novoLOG) injection 8 Units (8 Units Intravenous Given 01/07/23 2150)     IMPRESSION / MDM / ASSESSMENT AND PLAN / ED COURSE  I reviewed the triage vital signs and the nursing notes.                              47 y.o. male with past medical history of hypertension, hyperlipidemia, diabetes, and stroke presents to the ED complaining of weakness, malaise, and hyperglycemia for about the past week.  Patient's presentation is most consistent with acute presentation with potential threat to life or bodily function.  Differential diagnosis includes, but is not limited to, DKA, hyperglycemia, HHS, electrolyte abnormality, AKI, anemia.  Patient well-appearing and in no acute distress, vital signs are unremarkable.  He appears  clinically dehydrated and is noted to be hyperglycemic, but there is no evidence of DKA or other acute electrolyte abnormality.  No AKI noted and no significant anemia or leukocytosis noted.  No apparent infectious process, patient states he has been back on metformin for the past 4 days after being out of it for a long time.  We will hydrate with IV fluids and give dose of IV insulin for his hyperglycemia.  As long as glucose is improving, patient will be appropriate for outpatient follow-up to establish care with PCP for adjustments in his medication regimen.  Glucose improved following IV fluids and insulin, patient reports feeling better.  He is appropriate for discharge home with outpatient follow-up, referrals placed for him to establish care with PCP and he was counseled to return to the ED for new or worsening symptoms.  Patient agrees with plan.      FINAL CLINICAL IMPRESSION(S) / ED DIAGNOSES   Final diagnoses:  Hyperglycemia     Rx / DC Orders   ED Discharge Orders          Ordered    Ambulatory Referral to Primary Care (Establish Care)       Comments: Needs follow-up for management of diabetes.   01/07/23 2250             Note:  This document was prepared using Dragon voice recognition software and may include unintentional dictation errors.   Chesley Noon, MD 01/07/23 2252

## 2023-01-07 NOTE — ED Triage Notes (Signed)
Pt reports elevated blood sugar today.  Pt is taking his metformin.  Pt denies n/v/d  pt states he has lower back pain.  Pt alert  speech clear.

## 2023-05-16 ENCOUNTER — Telehealth: Payer: Self-pay | Admitting: Nurse Practitioner

## 2023-05-16 DIAGNOSIS — K047 Periapical abscess without sinus: Secondary | ICD-10-CM

## 2023-05-16 MED ORDER — PENICILLIN V POTASSIUM 500 MG PO TABS
500.0000 mg | ORAL_TABLET | Freq: Three times a day (TID) | ORAL | 0 refills | Status: AC
Start: 2023-05-16 — End: 2023-05-23

## 2023-05-16 NOTE — Progress Notes (Signed)
E-Visit for Dental Pain  We are sorry that you are not feeling well.  Here is how we plan to help!  Based on what you have shared with me in the questionnaire, it sounds like you have an infection related to one of your broken teeth  Pen VK 500mg  3 times a day for 7 days  It is imperative that you see a dentist within 10 days of this eVisit to determine the cause of the dental pain and be sure it is adequately treated  A toothache or tooth pain is caused when the nerve in the root of a tooth or surrounding a tooth is irritated. Dental (tooth) infection, decay, injury, or loss of a tooth are the most common causes of dental pain. Pain may also occur after an extraction (tooth is pulled out). Pain sometimes originates from other areas and radiates to the jaw, thus appearing to be tooth pain.Bacteria growing inside your mouth can contribute to gum disease and dental decay, both of which can cause pain. A toothache occurs from inflammation of the central portion of the tooth called pulp. The pulp contains nerve endings that are very sensitive to pain. Inflammation to the pulp or pulpitis may be caused by dental cavities, trauma, and infection.    HOME CARE:   For toothaches: Over-the-counter pain medications such as acetaminophen or ibuprofen may be used. Take these as directed on the package while you arrange for a dental appointment. Avoid very cold or hot foods, because they may make the pain worse. You may get relief from biting on a cotton ball soaked in oil of cloves. You can get oil of cloves at most drug stores.  For jaw pain:  Aspirin may be helpful for problems in the joint of the jaw in adults. If pain happens every time you open your mouth widely, the temporomandibular joint (TMJ) may be the source of the pain. Yawning or taking a large bite of food may worsen the pain. An appointment with your doctor or dentist will help you find the cause.     GET HELP RIGHT AWAY IF:  You have a  high fever or chills If you have had a recent head or face injury and develop headache, light headedness, nausea, vomiting, or other symptoms that concern you after an injury to your face or mouth, you could have a more serious injury in addition to your dental injury. A facial rash associated with a toothache: This condition may improve with medication. Contact your doctor for them to decide what is appropriate. Any jaw pain occurring with chest pain: Although jaw pain is most commonly caused by dental disease, it is sometimes referred pain from other areas. People with heart disease, especially people who have had stents placed, people with diabetes, or those who have had heart surgery may have jaw pain as a symptom of heart attack or angina. If your jaw or tooth pain is associated with lightheadedness, sweating, or shortness of breath, you should see a doctor as soon as possible. Trouble swallowing or excessive pain or bleeding from gums: If you have a history of a weakened immune system, diabetes, or steroid use, you may be more susceptible to infections. Infections can often be more severe and extensive or caused by unusual organisms. Dental and gum infections in people with these conditions may require more aggressive treatment. An abscess may need draining or IV antibiotics, for example.  MAKE SURE YOU   Understand these instructions. Will watch your condition.  Will get help right away if you are not doing well or get worse.  Thank you for choosing an e-visit.  Your e-visit answers were reviewed by a board certified advanced clinical practitioner to complete your personal care plan. Depending upon the condition, your plan could have included both over the counter or prescription medications.  Please review your pharmacy choice. Make sure the pharmacy is open so you can pick up prescription now. If there is a problem, you may contact your provider through [employer] and have the  prescription routed to another pharmacy.  Your safety is important to [place]. If you have drug allergies check your prescription carefully.   For the next 24 hours you can use MyChart to ask questions about today's visit, request a non-urgent call back, or ask for a work or school excuse. You will get an email in the next two days asking about your experience. I hope that your e-visit has been valuable and will speed your recovery.  Meds ordered this encounter  Medications   penicillin v potassium (VEETID) 500 MG tablet    Sig: Take 1 tablet (500 mg total) by mouth 3 (three) times daily for 7 days.    Dispense:  21 tablet    Refill:  0    I spent approximately 5 minutes reviewing the patient's history, current symptoms and coordinating their care today.

## 2023-07-20 ENCOUNTER — Telehealth: Payer: BLUE CROSS/BLUE SHIELD | Admitting: Family Medicine

## 2023-07-20 DIAGNOSIS — K047 Periapical abscess without sinus: Secondary | ICD-10-CM

## 2023-07-20 MED ORDER — AMOXICILLIN-POT CLAVULANATE 875-125 MG PO TABS: 1.0000 | ORAL_TABLET | Freq: Two times a day (BID) | ORAL | 0 refills | Status: DC

## 2023-07-20 NOTE — Progress Notes (Signed)

## 2023-08-26 ENCOUNTER — Telehealth: Payer: BLUE CROSS/BLUE SHIELD | Admitting: Physician Assistant

## 2023-08-26 DIAGNOSIS — K047 Periapical abscess without sinus: Secondary | ICD-10-CM

## 2023-08-26 MED ORDER — NAPROXEN 500 MG PO TABS
500.0000 mg | ORAL_TABLET | Freq: Two times a day (BID) | ORAL | 0 refills | Status: DC
Start: 2023-08-26 — End: 2023-08-26

## 2023-08-26 MED ORDER — AMOXICILLIN-POT CLAVULANATE 875-125 MG PO TABS
1.0000 | ORAL_TABLET | Freq: Two times a day (BID) | ORAL | 0 refills | Status: DC
Start: 2023-08-26 — End: 2023-08-26

## 2023-08-26 MED ORDER — NAPROXEN 500 MG PO TABS
500.0000 mg | ORAL_TABLET | Freq: Two times a day (BID) | ORAL | 0 refills | Status: DC
Start: 2023-08-26 — End: 2023-11-08

## 2023-08-26 MED ORDER — AMOXICILLIN-POT CLAVULANATE 875-125 MG PO TABS
1.0000 | ORAL_TABLET | Freq: Two times a day (BID) | ORAL | 0 refills | Status: DC
Start: 2023-08-26 — End: 2023-11-08

## 2023-08-26 NOTE — Progress Notes (Signed)
E-Visit for Dental Pain  We are sorry that you are not feeling well.  Here is how we plan to help!  Based on what you have shared with me in the questionnaire, it sounds like you have a dental infection. This seems to be a reoccurring thing every few months. We will treat via e-visit one final time but you need to reach out to get a follow-up with a dental provider to extract this tooth. I have prescribed Augmentin 875-125mg  twice a day for 7 days and Naprosyn 500mg  2 times a day for 7 days for discomfort  It is imperative that you see a dentist within 10 days of this eVisit to determine the cause of the dental pain and be sure it is adequately treated  A toothache or tooth pain is caused when the nerve in the root of a tooth or surrounding a tooth is irritated. Dental (tooth) infection, decay, injury, or loss of a tooth are the most common causes of dental pain. Pain may also occur after an extraction (tooth is pulled out). Pain sometimes originates from other areas and radiates to the jaw, thus appearing to be tooth pain.Bacteria growing inside your mouth can contribute to gum disease and dental decay, both of which can cause pain. A toothache occurs from inflammation of the central portion of the tooth called pulp. The pulp contains nerve endings that are very sensitive to pain. Inflammation to the pulp or pulpitis may be caused by dental cavities, trauma, and infection.    HOME CARE:   For toothaches: Over-the-counter pain medications such as acetaminophen or ibuprofen may be used. Take these as directed on the package while you arrange for a dental appointment. Avoid very cold or hot foods, because they may make the pain worse. You may get relief from biting on a cotton ball soaked in oil of cloves. You can get oil of cloves at most drug stores.  For jaw pain:  Aspirin may be helpful for problems in the joint of the jaw in adults. If pain happens every time you open your mouth widely, the  temporomandibular joint (TMJ) may be the source of the pain. Yawning or taking a large bite of food may worsen the pain. An appointment with your doctor or dentist will help you find the cause.     GET HELP RIGHT AWAY IF:  You have a high fever or chills If you have had a recent head or face injury and develop headache, light headedness, nausea, vomiting, or other symptoms that concern you after an injury to your face or mouth, you could have a more serious injury in addition to your dental injury. A facial rash associated with a toothache: This condition may improve with medication. Contact your doctor for them to decide what is appropriate. Any jaw pain occurring with chest pain: Although jaw pain is most commonly caused by dental disease, it is sometimes referred pain from other areas. People with heart disease, especially people who have had stents placed, people with diabetes, or those who have had heart surgery may have jaw pain as a symptom of heart attack or angina. If your jaw or tooth pain is associated with lightheadedness, sweating, or shortness of breath, you should see a doctor as soon as possible. Trouble swallowing or excessive pain or bleeding from gums: If you have a history of a weakened immune system, diabetes, or steroid use, you may be more susceptible to infections. Infections can often be more severe and extensive  or caused by unusual organisms. Dental and gum infections in people with these conditions may require more aggressive treatment. An abscess may need draining or IV antibiotics, for example.  MAKE SURE YOU   Understand these instructions. Will watch your condition. Will get help right away if you are not doing well or get worse.  Thank you for choosing an e-visit.  Your e-visit answers were reviewed by a board certified advanced clinical practitioner to complete your personal care plan. Depending upon the condition, your plan could have included both over the  counter or prescription medications.  Please review your pharmacy choice. Make sure the pharmacy is open so you can pick up prescription now. If there is a problem, you may contact your provider through [employer] and have the prescription routed to another pharmacy.  Your safety is important to [place]. If you have drug allergies check your prescription carefully.   For the next 24 hours you can use MyChart to ask questions about today's visit, request a non-urgent call back, or ask for a work or school excuse. You will get an email in the next two days asking about your experience. I hope that your e-visit has been valuable and will speed your recovery.

## 2023-08-26 NOTE — Addendum Note (Signed)
Addended by: Waldon Merl on: 08/26/2023 06:08 PM   Modules accepted: Orders

## 2023-08-26 NOTE — Progress Notes (Signed)
I have spent 5 minutes in review of e-visit questionnaire, review and updating patient chart, medical decision making and response to patient.   Mia Milan Cody Jacklynn Dehaas, PA-C    

## 2023-11-08 ENCOUNTER — Telehealth: Payer: BLUE CROSS/BLUE SHIELD | Admitting: Physician Assistant

## 2023-11-08 DIAGNOSIS — K047 Periapical abscess without sinus: Secondary | ICD-10-CM | POA: Diagnosis not present

## 2023-11-08 MED ORDER — NAPROXEN 500 MG PO TABS
500.0000 mg | ORAL_TABLET | Freq: Two times a day (BID) | ORAL | 0 refills | Status: AC
Start: 2023-11-08 — End: ?

## 2023-11-08 MED ORDER — AMOXICILLIN-POT CLAVULANATE 875-125 MG PO TABS
1.0000 | ORAL_TABLET | Freq: Two times a day (BID) | ORAL | 0 refills | Status: AC
Start: 2023-11-08 — End: ?

## 2023-11-08 NOTE — Progress Notes (Signed)
E-Visit for Dental Pain  We are sorry that you are not feeling well.  Here is how we plan to help!  Based on what you have shared with me in the questionnaire, it sounds like you have a dental infection.   Augmentin 875-125mg  twice a day for 7 days and Naprosyn 500mg  2 times a day for 7 days for discomfort  It is imperative that you see a dentist within 10 days of this eVisit to determine the cause of the dental pain and be sure it is adequately treated  A toothache or tooth pain is caused when the nerve in the root of a tooth or surrounding a tooth is irritated. Dental (tooth) infection, decay, injury, or loss of a tooth are the most common causes of dental pain. Pain may also occur after an extraction (tooth is pulled out). Pain sometimes originates from other areas and radiates to the jaw, thus appearing to be tooth pain.Bacteria growing inside your mouth can contribute to gum disease and dental decay, both of which can cause pain. A toothache occurs from inflammation of the central portion of the tooth called pulp. The pulp contains nerve endings that are very sensitive to pain. Inflammation to the pulp or pulpitis may be caused by dental cavities, trauma, and infection.    HOME CARE:   For toothaches: Over-the-counter pain medications such as acetaminophen or ibuprofen may be used. Take these as directed on the package while you arrange for a dental appointment. Avoid very cold or hot foods, because they may make the pain worse. You may get relief from biting on a cotton ball soaked in oil of cloves. You can get oil of cloves at most drug stores.  For jaw pain:  Aspirin may be helpful for problems in the joint of the jaw in adults. If pain happens every time you open your mouth widely, the temporomandibular joint (TMJ) may be the source of the pain. Yawning or taking a large bite of food may worsen the pain. An appointment with your doctor or dentist will help you find the cause.      GET HELP RIGHT AWAY IF:  You have a high fever or chills If you have had a recent head or face injury and develop headache, light headedness, nausea, vomiting, or other symptoms that concern you after an injury to your face or mouth, you could have a more serious injury in addition to your dental injury. A facial rash associated with a toothache: This condition may improve with medication. Contact your doctor for them to decide what is appropriate. Any jaw pain occurring with chest pain: Although jaw pain is most commonly caused by dental disease, it is sometimes referred pain from other areas. People with heart disease, especially people who have had stents placed, people with diabetes, or those who have had heart surgery may have jaw pain as a symptom of heart attack or angina. If your jaw or tooth pain is associated with lightheadedness, sweating, or shortness of breath, you should see a doctor as soon as possible. Trouble swallowing or excessive pain or bleeding from gums: If you have a history of a weakened immune system, diabetes, or steroid use, you may be more susceptible to infections. Infections can often be more severe and extensive or caused by unusual organisms. Dental and gum infections in people with these conditions may require more aggressive treatment. An abscess may need draining or IV antibiotics, for example.  MAKE SURE YOU   Understand these  instructions. Will watch your condition. Will get help right away if you are not doing well or get worse.  Thank you for choosing an e-visit.  Your e-visit answers were reviewed by a board certified advanced clinical practitioner to complete your personal care plan. Depending upon the condition, your plan could have included both over the counter or prescription medications.  Please review your pharmacy choice. Make sure the pharmacy is open so you can pick up prescription now. If there is a problem, you may contact your provider  through [employer] and have the prescription routed to another pharmacy.  Your safety is important to [place]. If you have drug allergies check your prescription carefully.   For the next 24 hours you can use MyChart to ask questions about today's visit, request a non-urgent call back, or ask for a work or school excuse. You will get an email in the next two days asking about your experience. I hope that your e-visit has been valuable and will speed your recovery.  I have spent 5 minutes in review of e-visit questionnaire, review and updating patient chart, medical decision making and response to patient.   Margaretann Loveless, PA-C

## 2024-04-27 ENCOUNTER — Encounter: Payer: Self-pay | Admitting: Emergency Medicine

## 2024-04-27 ENCOUNTER — Other Ambulatory Visit: Payer: Self-pay

## 2024-04-27 DIAGNOSIS — Z5321 Procedure and treatment not carried out due to patient leaving prior to being seen by health care provider: Secondary | ICD-10-CM | POA: Insufficient documentation

## 2024-04-27 DIAGNOSIS — R739 Hyperglycemia, unspecified: Secondary | ICD-10-CM | POA: Insufficient documentation

## 2024-04-27 LAB — CBG MONITORING, ED: Glucose-Capillary: 314 mg/dL — ABNORMAL HIGH (ref 70–99)

## 2024-04-27 LAB — COMPREHENSIVE METABOLIC PANEL WITH GFR
ALT: 19 U/L (ref 0–44)
AST: 20 U/L (ref 15–41)
Albumin: 3.4 g/dL — ABNORMAL LOW (ref 3.5–5.0)
Alkaline Phosphatase: 76 U/L (ref 38–126)
Anion gap: 8 (ref 5–15)
BUN: 10 mg/dL (ref 6–20)
CO2: 29 mmol/L (ref 22–32)
Calcium: 9.1 mg/dL (ref 8.9–10.3)
Chloride: 99 mmol/L (ref 98–111)
Creatinine, Ser: 0.86 mg/dL (ref 0.61–1.24)
GFR, Estimated: >60 mL/min (ref >60)
Glucose, Bld: 362 mg/dL — ABNORMAL HIGH (ref 70–99)
Potassium: 3.6 mmol/L (ref 3.5–5.1)
Sodium: 136 mmol/L (ref 135–145)
Total Bilirubin: 0.6 mg/dL (ref 0.0–1.2)
Total Protein: 6.7 g/dL (ref 6.5–8.1)

## 2024-04-27 LAB — CBC WITH DIFFERENTIAL/PLATELET
Abs Immature Granulocytes: 0.02 K/uL (ref 0.00–0.07)
Basophils Absolute: 0 K/uL (ref 0.0–0.1)
Basophils Relative: 1 %
Eosinophils Absolute: 0.1 K/uL (ref 0.0–0.5)
Eosinophils Relative: 2 %
HCT: 37 % — ABNORMAL LOW (ref 39.0–52.0)
Hemoglobin: 12.8 g/dL — ABNORMAL LOW (ref 13.0–17.0)
Immature Granulocytes: 0 %
Lymphocytes Relative: 47 %
Lymphs Abs: 2.6 K/uL (ref 0.7–4.0)
MCH: 28.7 pg (ref 26.0–34.0)
MCHC: 34.6 g/dL (ref 30.0–36.0)
MCV: 83 fL (ref 80.0–100.0)
Monocytes Absolute: 0.4 K/uL (ref 0.1–1.0)
Monocytes Relative: 7 %
Neutro Abs: 2.4 K/uL (ref 1.7–7.7)
Neutrophils Relative %: 43 %
Platelets: 283 K/uL (ref 150–400)
RBC: 4.46 MIL/uL (ref 4.22–5.81)
RDW: 12.5 % (ref 11.5–15.5)
WBC: 5.6 K/uL (ref 4.0–10.5)
nRBC: 0 % (ref 0.0–0.2)

## 2024-04-27 MED ORDER — SODIUM CHLORIDE 0.9 % IV BOLUS (SEPSIS): 1000.0000 mL | Freq: Once | INTRAVENOUS | Status: DC

## 2024-04-27 MED ORDER — METFORMIN HCL 500 MG PO TABS: 1000.0000 mg | ORAL_TABLET | Freq: Once | ORAL | Status: DC

## 2024-04-27 NOTE — ED Triage Notes (Signed)
 Pt presents to the ED via POV with complaints of hyperglycemia - he notes being out of his meds since the beginning of the year. He hasn't checked his CBG in months but knows its high. A&Ox4 at this time. Denies CP or SOB.    CBG-314 in triage.

## 2024-04-28 ENCOUNTER — Emergency Department
Admission: EM | Admit: 2024-04-28 | Discharge: 2024-04-28 | Discharge status: Against Medical Advice | Payer: Self-pay | Attending: Emergency Medicine | Admitting: Emergency Medicine

## 2024-04-28 NOTE — ED Notes (Signed)
 No answer when called several times from lobby

## 2024-07-07 ENCOUNTER — Other Ambulatory Visit: Payer: Self-pay

## 2024-07-07 ENCOUNTER — Emergency Department: Payer: Self-pay

## 2024-07-07 ENCOUNTER — Emergency Department
Admission: EM | Admit: 2024-07-07 | Discharge: 2024-07-07 | Disposition: A | Discharge status: Home / Self Care | Payer: Self-pay | Attending: Emergency Medicine | Admitting: Emergency Medicine

## 2024-07-07 DIAGNOSIS — F430 Acute stress reaction: Secondary | ICD-10-CM | POA: Insufficient documentation

## 2024-07-07 DIAGNOSIS — R110 Nausea: Secondary | ICD-10-CM | POA: Insufficient documentation

## 2024-07-07 LAB — LIPASE, BLOOD: Lipase: 26 U/L (ref 11–51)

## 2024-07-07 LAB — COMPREHENSIVE METABOLIC PANEL WITH GFR
ALT: 15 U/L (ref 0–44)
AST: 15 U/L (ref 15–41)
Albumin: 4.1 g/dL (ref 3.5–5.0)
Alkaline Phosphatase: 77 U/L (ref 38–126)
Anion gap: 11 (ref 5–15)
BUN: 12 mg/dL (ref 6–20)
CO2: 31 mmol/L (ref 22–32)
Calcium: 9.9 mg/dL (ref 8.9–10.3)
Chloride: 95 mmol/L — ABNORMAL LOW (ref 98–111)
Creatinine, Ser: 1.33 mg/dL — ABNORMAL HIGH (ref 0.61–1.24)
GFR, Estimated: >60 mL/min (ref >60)
Glucose, Bld: 283 mg/dL — ABNORMAL HIGH (ref 70–99)
Potassium: 4 mmol/L (ref 3.5–5.1)
Sodium: 137 mmol/L (ref 135–145)
Total Bilirubin: 0.9 mg/dL (ref 0.0–1.2)
Total Protein: 7.7 g/dL (ref 6.5–8.1)

## 2024-07-07 LAB — CBC
HCT: 43.2 % (ref 39.0–52.0)
Hemoglobin: 14.6 g/dL (ref 13.0–17.0)
MCH: 28.3 pg (ref 26.0–34.0)
MCHC: 33.8 g/dL (ref 30.0–36.0)
MCV: 83.7 fL (ref 80.0–100.0)
Platelets: 292 K/uL (ref 150–400)
RBC: 5.16 MIL/uL (ref 4.22–5.81)
RDW: 12.3 % (ref 11.5–15.5)
WBC: 4.9 K/uL (ref 4.0–10.5)
nRBC: 0 % (ref 0.0–0.2)

## 2024-07-07 LAB — TROPONIN I (HIGH SENSITIVITY): Troponin I (High Sensitivity): 12 ng/L (ref <18)

## 2024-07-07 MED ORDER — ONDANSETRON 4 MG PO TBDP: 4.0000 mg | ORAL_TABLET | Freq: Three times a day (TID) | ORAL | 0 refills | Status: AC | PRN

## 2024-07-07 MED ORDER — LORAZEPAM 1 MG PO TABS: 1.0000 mg | ORAL_TABLET | Freq: Every evening | ORAL | 0 refills | Status: AC | PRN

## 2024-07-07 NOTE — ED Provider Notes (Signed)
 [facility] Provider Note    Event Date/Time   First MD Initiated Contact with Patient 07/07/24 1407     (approximate)   History   Emesis and Chest Pain   HPI  Omar Dennis is a 48 y.o. male who presents with complaints of nausea, chest discomfort, anxiety, difficulty sleeping which has been ongoing over the last several days.  Patient reports recent large increase in daily stress which he thinks is causing this.  No chest pain at this time, no palpitations.  No new medications.  Denies drug use     Physical Exam   Triage Vital Signs: ED Triage Vitals [07/07/24 1308]  Encounter Vitals Group     BP (!) 150/103     Girls Systolic BP Percentile      Girls Diastolic BP Percentile      Boys Systolic BP Percentile      Boys Diastolic BP Percentile      Pulse Rate (!) 110     Resp 18     Temp 98.4 F (36.9 C)     Temp Source Oral     SpO2 98 %     Weight      Height      Head Circumference      Peak Flow      Pain Score 7     Pain Loc      Pain Education      Exclude from Growth Chart     Most recent vital signs: Vitals:   07/07/24 1308  BP: (!) 150/103  Pulse: (!) 110  Resp: 18  Temp: 98.4 F (36.9 C)  SpO2: 98%     General: Awake, no distress.  CV:  Good peripheral perfusion.  Regular rate and rhythm on my exam, not tachycardic Resp:  Normal effort.  Clear to auscultation bilaterally Abd:  No distention.  Soft, nontender Other:     ED Results / Procedures / Treatments   Labs (all labs ordered are listed, but only abnormal results are displayed) Labs Reviewed  COMPREHENSIVE METABOLIC PANEL WITH GFR - Abnormal; Notable for the following components:      Result Value   Chloride 95 (*)    Glucose, Bld 283 (*)    Creatinine, Ser 1.33 (*)    All other components within normal limits  LIPASE, BLOOD  CBC  URINALYSIS, ROUTINE W REFLEX MICROSCOPIC  TROPONIN I (HIGH SENSITIVITY)  TROPONIN I (HIGH SENSITIVITY)     EKG  ED  ECG REPORT I, Lamar Price, the attending physician, personally viewed and interpreted this ECG.  Date: 07/07/2024  Rhythm: Sinus tachycardia QRS Axis: normal Intervals: normal ST/T Wave abnormalities: normal Narrative Interpretation: no evidence of acute ischemia    RADIOLOGY Chest x-ray viewed interpret by me, no acute abnormality    PROCEDURES:  Critical Care performed:   Procedures   MEDICATIONS ORDERED IN ED: Medications - No data to display   IMPRESSION / MDM / ASSESSMENT AND PLAN / ED COURSE  I reviewed the triage vital signs and the nursing notes. Patient's presentation is most consistent with acute illness / injury with system symptoms.  Patient presents with multiple symptoms at this detailed above, suspicious for anxiety/stress reaction especially given his inability to sleep.  Lab work reviewed and overall unremarkable today, exam is reassuring  Patient reports he had some nausea earlier today as well, will start the patient on ODT Zofran  as needed, prescribed a few pills of Ativan  to help  him sleep, will refer the patient for PCP referral, no indication for admission at this time, he and significant other agree with this plan        FINAL CLINICAL IMPRESSION(S) / ED DIAGNOSES   Final diagnoses:  Stress reaction     Rx / DC Orders   ED Discharge Orders          Ordered    ondansetron  (ZOFRAN -ODT) 4 MG disintegrating tablet  Every 8 hours PRN        07/07/24 1436    LORazepam  (ATIVAN ) 1 MG tablet  At bedtime PRN        07/07/24 1436    Ambulatory Referral to Primary Care (Establish Care)        07/07/24 1437             Note:  This document was prepared using Dragon voice recognition software and may include unintentional dictation errors.   Arlander Charleston, MD 07/07/24 [phone]

## 2024-07-07 NOTE — ED Notes (Signed)
 Patient in room eating snacks after being told to depart after discharged. Patient informed the room is needed for other pts and to exit. Pt becoming verbally aggressive stating to not rush him. Pt informed he was discharged 10 min ago and he is expected to snack and handle phone calls in lobby. Pt exiting cursing at this RN.

## 2024-07-07 NOTE — ED Triage Notes (Signed)
 Pt to ED via POV from home. Pt reports N/V, body pain and CP x1 wk. Pt reports his nerves have been tore up.

## 2024-08-15 ENCOUNTER — Encounter: Payer: Self-pay | Admitting: Emergency Medicine

## 2024-08-15 ENCOUNTER — Emergency Department
Admission: EM | Admit: 2024-08-15 | Discharge: 2024-08-15 | Disposition: A | Discharge status: Home / Self Care | Payer: Self-pay | Attending: Emergency Medicine | Admitting: Emergency Medicine

## 2024-08-15 ENCOUNTER — Other Ambulatory Visit: Payer: Self-pay

## 2024-08-15 DIAGNOSIS — M79641 Pain in right hand: Secondary | ICD-10-CM | POA: Insufficient documentation

## 2024-08-15 DIAGNOSIS — M7989 Other specified soft tissue disorders: Secondary | ICD-10-CM | POA: Insufficient documentation

## 2024-08-15 DIAGNOSIS — M79672 Pain in left foot: Secondary | ICD-10-CM | POA: Insufficient documentation

## 2024-08-15 DIAGNOSIS — E119 Type 2 diabetes mellitus without complications: Secondary | ICD-10-CM | POA: Insufficient documentation

## 2024-08-15 DIAGNOSIS — M79671 Pain in right foot: Secondary | ICD-10-CM | POA: Insufficient documentation

## 2024-08-15 DIAGNOSIS — I10 Essential (primary) hypertension: Secondary | ICD-10-CM | POA: Insufficient documentation

## 2024-08-15 DIAGNOSIS — M79642 Pain in left hand: Secondary | ICD-10-CM | POA: Insufficient documentation

## 2024-08-15 LAB — CBG MONITORING, ED: Glucose-Capillary: 232 mg/dL — ABNORMAL HIGH (ref 70–99)

## 2024-08-15 MED ORDER — GABAPENTIN 100 MG PO CAPS: 100.0000 mg | ORAL_CAPSULE | Freq: Three times a day (TID) | ORAL | 0 refills | Status: DC

## 2024-08-15 NOTE — ED Provider Notes (Signed)
 [facility] Provider Note    Event Date/Time   First MD Initiated Contact with Patient 08/15/24 1319     (approximate)   History   Feet Pain and Hand Pain   HPI  Omar Dennis is a 48 y.o. male with history of hypertension, hyperlipidemia, diabetes, CVA and as listed in EMR presents to the emergency department for treatment and evaluation of bilateral feet and hand pain and swelling that has been intermittent over the past 3 weeks.  No known injury.  No history of gout.     Physical Exam    Vitals:   08/15/24 1322 08/15/24 1400  BP:  (!) 161/99  Pulse:  98  Resp:  18  Temp:    SpO2: 100% 100%    General: Awake, no distress.  CV:  Good peripheral perfusion.  Resp:  Normal effort.  Abd:  No distention.  Other:  No swelling in either hand.  Capillary refill is normal.  Radial pulses are equal.  Bilateral feet are warm and dry.  No focal swelling is noted.  Dorsalis pedis pulses are 2+ bilaterally.   ED Results / Procedures / Treatments   Labs (all labs ordered are listed, but only abnormal results are displayed)  Labs Reviewed  CBG MONITORING, ED - Abnormal; Notable for the following components:      Result Value   Glucose-Capillary 232 (*)    All other components within normal limits     EKG  Not indicated   RADIOLOGY  Image and radiology report reviewed and interpreted by me. Radiology report consistent with the same.  Not indicated  PROCEDURES:  Critical Care performed: No  Procedures   MEDICATIONS ORDERED IN ED:  Medications - No data to display   IMPRESSION / MDM / ASSESSMENT AND PLAN / ED COURSE   I have reviewed the triage note and vital signs. Vital signs are stable   Differential diagnosis includes, but is not limited to, diabetic neuropathy, gout  Patient's presentation is most consistent with acute illness / injury with system symptoms.  48 year old male presenting to the emergency department for  treatment and evaluation of bilateral hand and foot pain with numbness and tingling.  Symptoms started about 3 weeks ago.  He states that he has been taking his metformin  as prescribed but he does not check his glucose so he is unsure where it has been trending.  On exam, there is no pitting edema to the lower extremities or feet.  Pulses are equal.  Bilateral hands do not appear swollen and radial pulses are 2+.  Glucose is 232.  Plan will be to have him start gabapentin .  He was offered a refill of his blood pressure medications but he declines.  He states that he does not like the way they make him feel and he would prefer not to take them.  He was reminded of the risk for stroke or heart disease when left untreated.  He does want a referral for a new primary care provider, which was entered.  ER return precautions discussed      FINAL CLINICAL IMPRESSION(S) / ED DIAGNOSES   Final diagnoses:  Bilateral foot pain  Bilateral hand pain  Uncontrolled hypertension     Rx / DC Orders   ED Discharge Orders          Ordered    gabapentin  (NEURONTIN ) 100 MG capsule  3 times daily        08/15/24 1403  Ambulatory Referral to Primary Care (Establish Care)        08/15/24 1403             Note:  This document was prepared using Dragon voice recognition software and may include unintentional dictation errors.   Herlinda Kirk NOVAK, FNP 08/15/24 1447    Bradler, Evan K, MD 08/15/24 1539

## 2024-08-15 NOTE — ED Triage Notes (Signed)
 Patient arrives ambulatory by POV c/o bilateral feet and hands swelling and painful x 3 weeks. Patient reports tingling along with hot and cold. Patient states he stopped taking his lisinopril  a while ago and does not have a PCP.

## 2024-09-23 ENCOUNTER — Other Ambulatory Visit: Payer: Self-pay

## 2024-09-23 ENCOUNTER — Emergency Department
Admission: EM | Admit: 2024-09-23 | Discharge: 2024-09-23 | Disposition: A | Discharge status: Home / Self Care | Payer: Self-pay

## 2024-09-23 DIAGNOSIS — G629 Polyneuropathy, unspecified: Secondary | ICD-10-CM | POA: Insufficient documentation

## 2024-09-23 DIAGNOSIS — R739 Hyperglycemia, unspecified: Secondary | ICD-10-CM | POA: Insufficient documentation

## 2024-09-23 LAB — COMPREHENSIVE METABOLIC PANEL WITH GFR
ALT: 10 U/L (ref 0–44)
AST: 15 U/L (ref 15–41)
Albumin: 4 g/dL (ref 3.5–5.0)
Alkaline Phosphatase: 85 U/L (ref 38–126)
Anion gap: 10 (ref 5–15)
BUN: 10 mg/dL (ref 6–20)
CO2: 26 mmol/L (ref 22–32)
Calcium: 9.5 mg/dL (ref 8.9–10.3)
Chloride: 100 mmol/L (ref 98–111)
Creatinine, Ser: 0.9 mg/dL (ref 0.61–1.24)
GFR, Estimated: >60 mL/min
Glucose, Bld: 264 mg/dL — ABNORMAL HIGH (ref 70–99)
Potassium: 4 mmol/L (ref 3.5–5.1)
Sodium: 136 mmol/L (ref 135–145)
Total Bilirubin: 0.7 mg/dL (ref 0.0–1.2)
Total Protein: 7.1 g/dL (ref 6.5–8.1)

## 2024-09-23 LAB — CBC WITH DIFFERENTIAL/PLATELET
Abs Immature Granulocytes: 0 K/uL (ref 0.00–0.07)
Basophils Absolute: 0 K/uL (ref 0.0–0.1)
Basophils Relative: 1 %
Eosinophils Absolute: 0 K/uL (ref 0.0–0.5)
Eosinophils Relative: 1 %
HCT: 39.9 % (ref 39.0–52.0)
Hemoglobin: 14 g/dL (ref 13.0–17.0)
Immature Granulocytes: 0 %
Lymphocytes Relative: 40 %
Lymphs Abs: 1.5 K/uL (ref 0.7–4.0)
MCH: 28.9 pg (ref 26.0–34.0)
MCHC: 35.1 g/dL (ref 30.0–36.0)
MCV: 82.4 fL (ref 80.0–100.0)
Monocytes Absolute: 0.3 K/uL (ref 0.1–1.0)
Monocytes Relative: 9 %
Neutro Abs: 1.8 K/uL (ref 1.7–7.7)
Neutrophils Relative %: 49 %
Platelets: 292 K/uL (ref 150–400)
RBC: 4.84 MIL/uL (ref 4.22–5.81)
RDW: 12.1 % (ref 11.5–15.5)
WBC: 3.7 K/uL — ABNORMAL LOW (ref 4.0–10.5)
nRBC: 0 % (ref 0.0–0.2)

## 2024-09-23 MED ORDER — GABAPENTIN 100 MG PO CAPS
100.0000 mg | ORAL_CAPSULE | Freq: Once | ORAL | Status: AC
  Administered 2024-09-23: 100 mg via ORAL
  Filled 2024-09-23: qty 1

## 2024-09-23 MED ORDER — GABAPENTIN 100 MG PO CAPS: 100.0000 mg | ORAL_CAPSULE | Freq: Three times a day (TID) | ORAL | 0 refills | Status: AC

## 2024-09-23 NOTE — Discharge Instructions (Signed)
 Referral was placed for spine primary care.  Continue to take your gabapentin .  Please return for any new, worsening, or changing symptoms or other concerns.  It was a pleasure caring for you today.

## 2024-09-23 NOTE — ED Triage Notes (Signed)
 Pt presents to ED from home C/O bilateral foot pain and swelling since waking this AM. Reports chronic pain and swelling in feet, worse today.

## 2024-09-23 NOTE — ED Provider Notes (Signed)
 "  [facility] Provider Note    Event Date/Time   First MD Initiated Contact with Patient 09/23/24 1215     (approximate)   History   Leg Swelling   HPI  Omar Dennis is a 48 y.o. male who presents today for evaluation of bilateral foot pain that he describes as pins-and-needles for several months.  He reports that he was given gabapentin  but has ran out and notes that his symptoms have not returned.  His gabapentin  provided improvement in her symptoms.  No injuries.  No weakness.  No skin color changes.  Patient Active Problem List   Diagnosis Date Noted   Left-sided weakness 10/17/2016          Physical Exam   Triage Vital Signs: ED Triage Vitals  Encounter Vitals Group     BP 09/23/24 1144 (!) 146/115     Girls Systolic BP Percentile --      Girls Diastolic BP Percentile --      Boys Systolic BP Percentile --      Boys Diastolic BP Percentile --      Pulse Rate 09/23/24 1144 (!) 108     Resp 09/23/24 1144 20     Temp 09/23/24 1144 99.1 F (37.3 C)     Temp Source 09/23/24 1144 Oral     SpO2 09/23/24 1146 97 %     Weight --      Height 09/23/24 1141 5' 9 (1.753 m)     Head Circumference --      Peak Flow --      Pain Score 09/23/24 1140 10     Pain Loc --      Pain Education --      Exclude from Growth Chart --     Most recent vital signs: Vitals:   09/23/24 1146 09/23/24 1310  BP:  (!) 142/100  Pulse:  90  Resp:  18  Temp:    SpO2: 97% 100%    Physical Exam Vitals and nursing note reviewed.  Constitutional:      General: Awake and alert. No acute distress.    Appearance: Normal appearance. The patient is normal weight.  HENT:     Head: Normocephalic and atraumatic.     Mouth: Mucous membranes are moist.  Eyes:     General: PERRL. Normal EOMs        Right eye: No discharge.        Left eye: No discharge.     Conjunctiva/sclera: Conjunctivae normal.  Cardiovascular:     Rate and Rhythm: Normal rate and regular  rhythm.     Pulses: Normal pulses.  Pulmonary:     Effort: Pulmonary effort is normal. No respiratory distress.     Breath sounds: Normal breath sounds.  Abdominal:     Abdomen is soft. There is no abdominal tenderness. No rebound or guarding. No distention. Musculoskeletal:        General: No swelling. Normal range of motion.     Cervical back: Normal range of motion and neck supple.  Normal appearing feet bilaterally. Sensation intact to light touch throughout.  Normal pedal pulses bilaterally.  No skin changes.  Active and passive range of motion intact and painless to toes, ankles, knees, and hips bilaterally.  No open wounds. Skin:    General: Skin is warm and dry.     Capillary Refill: Capillary refill takes less than 2 seconds.     Findings: No rash.  Neurological:  Mental Status: The patient is awake and alert.      ED Results / Procedures / Treatments   Labs (all labs ordered are listed, but only abnormal results are displayed) Labs Reviewed  COMPREHENSIVE METABOLIC PANEL WITH GFR - Abnormal; Notable for the following components:      Result Value   Glucose, Bld 264 (*)    All other components within normal limits  CBC WITH DIFFERENTIAL/PLATELET - Abnormal; Notable for the following components:   WBC 3.7 (*)    All other components within normal limits     EKG     RADIOLOGY     PROCEDURES:  Critical Care performed:   Procedures   MEDICATIONS ORDERED IN ED: Medications  gabapentin  (NEURONTIN ) capsule 100 mg (100 mg Oral Given 09/23/24 1307)     IMPRESSION / MDM / ASSESSMENT AND PLAN / ED COURSE  I reviewed the triage vital signs and the nursing notes.   Differential diagnosis includes, but is not limited to, neuropathy, claudication, dependent edema.  Patient is awake and alert, hemodynamically stable and afebrile.  He is nontoxic in appearance.  I reviewed the patient's chart.  Patient was seen on 08/15/2024 with the same complaint and  given gabapentin .  Patient appears well on exam.  He has normal palpable pedal pulses bilaterally, feet are warm and well-perfused bilaterally, not consistent with acute arterial occlusion.  There is no pitting edema, no calf tenderness, I do not suspect DVT.  Symptoms have been ongoing for several months.  Labs obtained in triage are revealing for hyperglycemia, likely contributing/causing his neuropathy which I explained at length to the patient.  He has a normal anion gap, normal bicarb, not consistent with DKA.  I recommended that he gain proper control over his glucose for better management of his neuropathy.  He was referred to primary care to establish care.  Reports that he has diabetes medicines that he has not been taking, though reports that he now plans to.  There is no warmth or erythema to suggest cellulitis, no open wounds.  He is asking for refill of his gabapentin  which was given to him, given that he was only given 1 month supply so that make sense that he has run out at this time.  He does not appear to be abusing this.  We discussed symptomatic management and return precautions.  Also discussed the importance of close outpatient follow-up.  Patient understands and agrees with plan.  Discharged in stable condition.   Patient's presentation is most consistent with exacerbation of chronic illness.   FINAL CLINICAL IMPRESSION(S) / ED DIAGNOSES   Final diagnoses:  Neuropathy  Hyperglycemia     Rx / DC Orders   ED Discharge Orders          Ordered    Ambulatory Referral to Primary Care (Establish Care)       Comments: Hyperglycemia, neuropathy   09/23/24 1259    gabapentin  (NEURONTIN ) 100 MG capsule  3 times daily        09/23/24 1308             Note:  This document was prepared using Dragon voice recognition software and may include unintentional dictation errors.   Latoyia Tecson E, PA-C 09/23/24 1450    Nicholaus Rolland BRAVO, MD 09/23/24 1539  "
